# Patient Record
Sex: Male | Born: 1980 | Race: White | Hispanic: No | Marital: Married | Smoking: Never smoker
Health system: Southern US, Community
[De-identification: ages and names within clinical notes are randomized; demographics above are authoritative.]

---

## 2009-05-31 ENCOUNTER — Encounter: Admission: RE | Admit: 2009-05-31 | Discharge: 2009-05-31 | Payer: Self-pay | Admitting: Family Medicine

## 2012-10-03 ENCOUNTER — Other Ambulatory Visit: Payer: Self-pay | Admitting: Internal Medicine

## 2012-10-03 DIAGNOSIS — E041 Nontoxic single thyroid nodule: Secondary | ICD-10-CM

## 2012-10-06 ENCOUNTER — Other Ambulatory Visit: Payer: Self-pay

## 2013-12-12 ENCOUNTER — Telehealth: Payer: Self-pay | Admitting: Family Medicine

## 2013-12-12 NOTE — Telephone Encounter (Signed)
Patient would be a new patient.  He is requesting to be worked in soon b/c of back and knee issues.  Thanks!

## 2013-12-12 NOTE — Telephone Encounter (Signed)
Spoke with pt, scheduled for 12/18/13 @ 11:30a.

## 2013-12-18 ENCOUNTER — Other Ambulatory Visit (INDEPENDENT_AMBULATORY_CARE_PROVIDER_SITE_OTHER): Payer: BC Managed Care – PPO

## 2013-12-18 ENCOUNTER — Ambulatory Visit (INDEPENDENT_AMBULATORY_CARE_PROVIDER_SITE_OTHER)
Admission: RE | Admit: 2013-12-18 | Discharge: 2013-12-18 | Disposition: A | Discharge status: Home / Self Care | Payer: BC Managed Care – PPO | Source: Ambulatory Visit | Attending: Family Medicine | Admitting: Family Medicine

## 2013-12-18 ENCOUNTER — Ambulatory Visit (INDEPENDENT_AMBULATORY_CARE_PROVIDER_SITE_OTHER): Payer: BC Managed Care – PPO | Admitting: Family Medicine

## 2013-12-18 VITALS — BP 122/72 | HR 99 | Ht 73.0 in | Wt 207.0 lb

## 2013-12-18 DIAGNOSIS — M23204 Derangement of unspecified medial meniscus due to old tear or injury, left knee: Secondary | ICD-10-CM

## 2013-12-18 DIAGNOSIS — M25519 Pain in unspecified shoulder: Secondary | ICD-10-CM

## 2013-12-18 DIAGNOSIS — M25569 Pain in unspecified knee: Secondary | ICD-10-CM

## 2013-12-18 DIAGNOSIS — M23209 Derangement of unspecified meniscus due to old tear or injury, unspecified knee: Secondary | ICD-10-CM | POA: Insufficient documentation

## 2013-12-18 DIAGNOSIS — M23302 Other meniscus derangements, unspecified lateral meniscus, unspecified knee: Secondary | ICD-10-CM

## 2013-12-18 DIAGNOSIS — M542 Cervicalgia: Secondary | ICD-10-CM

## 2013-12-18 DIAGNOSIS — M25562 Pain in left knee: Secondary | ICD-10-CM

## 2013-12-18 DIAGNOSIS — M23201 Derangement of unspecified lateral meniscus due to old tear or injury, left knee: Secondary | ICD-10-CM

## 2013-12-18 DIAGNOSIS — M25512 Pain in left shoulder: Secondary | ICD-10-CM

## 2013-12-18 DIAGNOSIS — M23207 Derangement of unspecified meniscus due to old tear or injury, left knee: Secondary | ICD-10-CM

## 2013-12-18 DIAGNOSIS — M25511 Pain in right shoulder: Secondary | ICD-10-CM | POA: Insufficient documentation

## 2013-12-18 MED ORDER — NITROGLYCERIN 0.2 MG/HR TD PT24: MEDICATED_PATCH | TRANSDERMAL | Status: DC

## 2013-12-18 NOTE — Patient Instructions (Addendum)
Ice 20 minutes 2 times daily. Usually after activity and before bed. Exercises 3 times a week. For shoulder and knee.  Try pennsaid 2 times daily and stop if it hurts your stomach.  Decrease seat height.  Cages for your feet.  Vitamin D 2000 Iu daily Turmeric  twice daily.  Fish oil 2 grams daily.  For shoulder decrease weight to 50% of what you doing and increase 10 % a week Xray of your neck, no news is good news.  Nitroglycerin Protocol   Apply 1/4 nitroglycerin patch to affected area daily.  Change position of patch within the affected area every 24 hours.  You may experience a headache during the first 1-2 weeks of using the patch, these should subside.  If you experience headaches after beginning nitroglycerin patch treatment, you may take your preferred over the counter pain reliever.  Another side effect of the nitroglycerin patch is skin irritation or rash related to patch adhesive.  Please notify our office if you develop more severe headaches or rash, and stop the patch.  Tendon healing with nitroglycerin patch may require 12 to 24 weeks depending on the extent of injury.  Men should not use if taking Viagra, Cialis, or Levitra.   Do not use if you have migraines or rosacea.   See me again in 3 weeks.

## 2013-12-18 NOTE — Assessment & Plan Note (Signed)
Injected today, HEP, icing, and will try exercises on a regular basis Discussed with swelling maybe need further evaluation for Autoimmune with family history but will monitor.  RTC in 3 weeks.

## 2013-12-18 NOTE — Progress Notes (Signed)
[name] [address] [place], [place] [address] Phone: [phone] Subjective:     CC: Left shoulder pain and left knee pain  [id number] Brett Hoffman is a 33 y.o. male coming in with complaint of left shoulder and left knee pain.  Patient states left shoulder pain has been hurting for quite some time. Patient is a past medical history of having a labral tear and having that repaired multiple years ago. Patient states since that time he's always had a dull aching pain that seems to be worsening somewhat. States it hurts more when he does certain activities such as reaching across his body or trying to hold any type of heavy weight. Denies any numbness or tingling and denies any radiation down the arm. Denies any swelling but does have her seen. States that he feels he never became 100% after the surgery itself. Severity 7/10  Patient is also complaining of left knee pain. Seems to be on the lateral aspect of the knee. Patient has been biking significantly more and since then has had some mild discomfort. Patient states they can be a dull throbbing pain and give him some time to signs of instability. Patient denies any clicking but there is a lot of grinding he states. Patient does not remember any true injury but did do a lot of high impact sports when he was younger. The severity is 6/10.     Past medical history, social, surgical and family history all reviewed in electronic medical record.   Review of Systems: No headache, visual changes, nausea, vomiting, diarrhea, constipation, dizziness, abdominal pain, skin rash, fevers, chills, night sweats, weight loss, swollen lymph nodes, body aches, joint swelling, muscle aches, chest pain, shortness of breath, mood changes.   Objective Blood pressure 122/72, pulse 99, height  (1.854 m), weight 207 lb (93.895 kg), SpO2 96.00%.  General: No apparent distress alert and oriented x3 mood and affect normal,  dressed appropriately.  HEENT: Pupils equal, extraocular movements intact  Respiratory: Patient's speak in full sentences and does not appear short of breath  Cardiovascular: No lower extremity edema, non tender, no erythema  Skin: Warm dry intact with no signs of infection or rash on extremities or on axial skeleton.  Abdomen: Soft nontender  Neuro: Cranial nerves II through XII are intact, neurovascularly intact in all extremities with 2+ DTRs and 2+ pulses.  Lymph: No lymphadenopathy of posterior or anterior cervical chain or axillae bilaterally.  Gait normal with good balance and coordination.  MSK:  Non tender with full range of motion and good stability and symmetric strength and tone of  elbows, wrist, hip, and ankles bilaterally.  Shoulder: Left Inspection reveals no abnormalities, atrophy or asymmetry. Palpation is normal with no tenderness over AC joint or bicipital groove. ROM is full in all planes. 4/5 rotator cuff strength compared to the contralateral side. Positive signs of impingement Speeds and Yergason's tests normal. No labral pathology noted with negative Obrien's, negative clunk and good stability. Normal scapular function observed. No painful arc and no drop arm sign. No apprehension sign Lateral shoulder unremarkable    Knee: Left knee Normal to inspection with no erythema or effusion or obvious bony abnormalities. Moderate tenderness to palpation over the medial joint line ROM full in flexion and extension and lower leg rotation. Ligaments with solid consistent endpoints including ACL, PCL, LCL, MCL. Positive Mcmurray's, Apley's, and Thessalonian tests. Non painful patellar compression. Patellar glide with crepitus. Patellar and quadriceps tendons unremarkable.  Hamstring and quadriceps strength is normal.  Contralateral knee unremarkable  MSK US performed of: Left knee This study was ordered, performed, and interpreted by Terrilee Files D.O.  Knee: All  structures visualized. Chronic anterior medial and posterior medial meniscal tears noted. No significant displacement. Patient also does have an effusion of the suprapatellar pouch. Patellar Tendon unremarkable on long and transverse views without effusion. No abnormality of prepatellar bursa. LCL and MCL unremarkable on long and transverse views. No abnormality of origin of medial or lateral head of the gastrocnemius.  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   Procedure: Real-time Ultrasound Guided Injection of left knee Device: GE Logiq E  Ultrasound guided injection is preferred based studies that show increased duration, increased effect, greater accuracy, decreased procedural pain, increased response rate, and decreased cost with ultrasound guided versus blind injection.  Verbal informed consent obtained.  Time-out conducted.  Noted no overlying erythema, induration, or other signs of local infection.  Skin prepped in a sterile fashion.  Local anesthesia: Topical Ethyl chloride.  With sterile technique and under real time ultrasound guidance: With a 22-gauge 2 inch needle patient was injected with 4 cc of 0.5% Marcaine and 1 cc of Kenalog 40 mg/dL. This was from a superior lateral approach.  Completed without difficulty  Pain immediately resolved suggesting accurate placement of the medication.  Advised to call if fevers/chills, erythema, induration, drainage, or persistent bleeding.  Images permanently stored and available for review in the ultrasound unit.  Impression: Technically successful ultrasound guided injection.    Impression and Recommendations:     This case required medical decision making of moderate complexity.

## 2014-06-07 ENCOUNTER — Ambulatory Visit (INDEPENDENT_AMBULATORY_CARE_PROVIDER_SITE_OTHER): Payer: 59

## 2014-06-07 ENCOUNTER — Ambulatory Visit (INDEPENDENT_AMBULATORY_CARE_PROVIDER_SITE_OTHER): Payer: 59 | Admitting: Sports Medicine

## 2014-06-07 ENCOUNTER — Encounter: Payer: Self-pay | Admitting: Sports Medicine

## 2014-06-07 DIAGNOSIS — M23201 Derangement of unspecified lateral meniscus due to old tear or injury, left knee: Secondary | ICD-10-CM

## 2014-06-07 DIAGNOSIS — M23204 Derangement of unspecified medial meniscus due to old tear or injury, left knee: Principal | ICD-10-CM

## 2014-06-07 DIAGNOSIS — M23207 Derangement of unspecified meniscus due to old tear or injury, left knee: Secondary | ICD-10-CM | POA: Diagnosis not present

## 2014-06-07 DIAGNOSIS — M25562 Pain in left knee: Secondary | ICD-10-CM

## 2014-06-07 DIAGNOSIS — M25561 Pain in right knee: Secondary | ICD-10-CM

## 2014-06-07 MED ORDER — NAPROXEN-ESOMEPRAZOLE 500-20 MG PO TBEC: 1.0000 | DELAYED_RELEASE_TABLET | Freq: Two times a day (BID) | ORAL | Status: DC

## 2014-06-07 NOTE — Assessment & Plan Note (Signed)
Persistent pain on the lateral aspect of the left knee, slight response to return of pain after knee injection, there was no immediate relief in pain suggesting an extra-articular structure. No mechanical symptoms but does have some lateral popping suggestive of any plica versus patellofemoral. Custom orthotics as above. X-rays, MRI, return to discuss MRI results.

## 2014-06-07 NOTE — Progress Notes (Signed)

## 2014-06-11 ENCOUNTER — Telehealth: Payer: Self-pay | Admitting: *Deleted

## 2014-06-11 ENCOUNTER — Ambulatory Visit (INDEPENDENT_AMBULATORY_CARE_PROVIDER_SITE_OTHER): Payer: 59

## 2014-06-11 DIAGNOSIS — G8929 Other chronic pain: Secondary | ICD-10-CM | POA: Diagnosis not present

## 2014-06-11 DIAGNOSIS — R936 Abnormal findings on diagnostic imaging of limbs: Secondary | ICD-10-CM

## 2014-06-11 DIAGNOSIS — M23207 Derangement of unspecified meniscus due to old tear or injury, left knee: Principal | ICD-10-CM

## 2014-06-11 DIAGNOSIS — M25562 Pain in left knee: Secondary | ICD-10-CM | POA: Diagnosis not present

## 2014-06-11 DIAGNOSIS — M23201 Derangement of unspecified lateral meniscus due to old tear or injury, left knee: Secondary | ICD-10-CM

## 2014-06-11 DIAGNOSIS — M23204 Derangement of unspecified medial meniscus due to old tear or injury, left knee: Principal | ICD-10-CM

## 2014-06-11 NOTE — Telephone Encounter (Signed)
Prior authorization approved for MRI left knee without contrast.  Approval dates 06/09/14-07/24/14.  Berkley HarveyAuth #[id number]

## 2014-10-26 ENCOUNTER — Ambulatory Visit: Payer: 59 | Admitting: Sports Medicine

## 2014-10-30 ENCOUNTER — Ambulatory Visit: Payer: 59 | Admitting: Sports Medicine

## 2014-11-02 ENCOUNTER — Encounter: Payer: Self-pay | Admitting: Sports Medicine

## 2014-11-02 ENCOUNTER — Ambulatory Visit (INDEPENDENT_AMBULATORY_CARE_PROVIDER_SITE_OTHER): Payer: 59 | Admitting: Sports Medicine

## 2014-11-02 ENCOUNTER — Ambulatory Visit (INDEPENDENT_AMBULATORY_CARE_PROVIDER_SITE_OTHER): Payer: 59

## 2014-11-02 DIAGNOSIS — M542 Cervicalgia: Secondary | ICD-10-CM

## 2014-11-02 DIAGNOSIS — M25512 Pain in left shoulder: Secondary | ICD-10-CM

## 2014-11-02 MED ORDER — MELOXICAM 15 MG PO TABS: ORAL_TABLET | ORAL | Status: DC

## 2014-11-02 NOTE — Progress Notes (Signed)
  Subjective:    CC: Neck and shoulder pain  HPI: This is a pleasant 34 year old male, he has a history of a left shoulder labral tear postarthroscopy, unfortunately he has continued to have pain that he localizes at the anterior and posterior joint line, with grinding, and pain with overhead activities, moderate, persistent without radiation, he also has neck pain, with occasional periscapular radiation on the left side. Symptoms are moderate, persistent, nothing going down the arm or hand.  Past medical history, Surgical history, Family history not pertinant except as noted below, Social history, Allergies, and medications have been entered into the medical record, reviewed, and no changes needed.   Review of Systems: No fevers, chills, night sweats, weight loss, chest pain, or shortness of breath.   Objective:    General: Well Developed, well nourished, and in no acute distress.  Neuro: Alert and oriented x3, extra-ocular muscles intact, sensation grossly intact.  HEENT: Normocephalic, atraumatic, pupils equal round reactive to light, neck supple, no masses, no lymphadenopathy, thyroid nonpalpable.  Skin: Warm and dry, no rashes. Cardiac: Regular rate and rhythm, no murmurs rubs or gallops, no lower extremity edema.  Respiratory: Clear to auscultation bilaterally. Not using accessory muscles, speaking in full sentences. Left Shoulder: Inspection reveals no abnormalities, atrophy or asymmetry. Palpation is normal with no tenderness over AC joint or bicipital groove. ROM is full in all planes. Positive Hawkins test, negative Neer and empty can signs, supraspinatus is weak. Speeds and Yergason's tests normal. Good stability with no translational instability, there is a positive crank test, and negative clunk test. Negative O'Brien's test. Normal scapular function observed. No painful arc and no drop arm sign. No apprehension sign Neck: Negative spurling's Full neck range of  motion Grip strength and sensation normal in bilateral hands Strength good C4 to T1 distribution No sensory change to C4 to T1 Reflexes normal  Impression and Recommendations:

## 2014-11-02 NOTE — Assessment & Plan Note (Signed)
Clinically does resemble both subacromial impingement as well as labral injury, he does have a history of a labral tear diagnosed by arthroscopy. Symptoms are predominantly impingement today so we will proceed with meloxicam, x-rays, formal PT, does have some neck pain as well, with minimal periscapular radiculopathy on the left. Return to see me in 6 weeks, at that point we will probably get an MRI arthrogram considering prior labral tear, so we will perform an injection then, I will probably add some medication into the subacromial space as well.

## 2015-02-26 ENCOUNTER — Encounter: Payer: Self-pay | Admitting: Sports Medicine

## 2015-02-26 ENCOUNTER — Ambulatory Visit (INDEPENDENT_AMBULATORY_CARE_PROVIDER_SITE_OTHER): Payer: 59 | Admitting: Sports Medicine

## 2015-02-26 VITALS — BP 125/80 | HR 108 | Temp 98.5°F | Resp 18 | Wt 205.1 lb

## 2015-02-26 DIAGNOSIS — M7711 Lateral epicondylitis, right elbow: Secondary | ICD-10-CM | POA: Diagnosis not present

## 2015-02-26 MED ORDER — HYDROCODONE-ACETAMINOPHEN 5-325 MG PO TABS: 1.0000 | ORAL_TABLET | Freq: Three times a day (TID) | ORAL | Status: DC | PRN

## 2015-02-26 NOTE — Progress Notes (Signed)
  Subjective:    CC: Right elbow pain  HPI: For many months this pleasant 34 year old male has had pain that he localizes at the common extensor tendon origin on the right side, previously diagnosed as tennis elbow, he did have an unguided injection provided great relief. Unfortunately he is having recurrence of pain and desires repeat interventional treatment today. Pain is moderate, persistent without radiation.  Past medical history, Surgical history, Family history not pertinant except as noted below, Social history, Allergies, and medications have been entered into the medical record, reviewed, and no changes needed.   Review of Systems: No fevers, chills, night sweats, weight loss, chest pain, or shortness of breath.   Objective:    General: Well Developed, well nourished, and in no acute distress.  Neuro: Alert and oriented x3, extra-ocular muscles intact, sensation grossly intact.  HEENT: Normocephalic, atraumatic, pupils equal round reactive to light, neck supple, no masses, no lymphadenopathy, thyroid nonpalpable.  Skin: Warm and dry, no rashes. Cardiac: Regular rate and rhythm, no murmurs rubs or gallops, no lower extremity edema.  Respiratory: Clear to auscultation bilaterally. Not using accessory muscles, speaking in full sentences. Right Elbow: Unremarkable to inspection. Range of motion full pronation, supination, flexion, extension. Strength is full to all of the above directions Stable to varus, valgus stress. Negative moving valgus stress test. Tender to palpation at the common extensor tendon origin Ulnar nerve does not sublux. Negative cubital tunnel Tinel's.  Procedure: Real-time Ultrasound Guided Injection of right percutaneous needle tenotomy of the common extensor tendon origin  Device: GE Logiq E  Verbal informed consent obtained.  Time-out conducted.  Noted no overlying erythema, induration, or other signs of local infection.  Skin prepped in a sterile  fashion.  Local anesthesia: Topical Ethyl chloride.  With sterile technique and under real time ultrasound guidance:  using a 25-gauge needle and made approximately 20 passes through the tendon, to its insertion on the lateral epicondyle while injecting a total of 1 mL kenalog 40, 5mL lidocaine. Completed without difficulty  Pain immediately resolved suggesting accurate placement of the medication.  Advised to call if fevers/chills, erythema, induration, drainage, or persistent bleeding.  Images permanently stored and available for review in the ultrasound unit.  Impression: Technically successful ultrasound guided injection.  Impression and Recommendations:

## 2015-02-26 NOTE — Assessment & Plan Note (Signed)
Percutaneous needle tenotomy as above. Strap with compressive dressing, home rehabilitation exercises given, Vicodin for postprocedural pain. Return in one month, consider PRP if no better.

## 2015-10-10 ENCOUNTER — Encounter: Payer: Self-pay | Admitting: Sports Medicine

## 2015-10-10 ENCOUNTER — Ambulatory Visit (INDEPENDENT_AMBULATORY_CARE_PROVIDER_SITE_OTHER): Payer: Managed Care, Other (non HMO) | Admitting: Sports Medicine

## 2015-10-10 VITALS — BP 123/76 | HR 108 | Resp 18 | Wt 209.6 lb

## 2015-10-10 DIAGNOSIS — M7711 Lateral epicondylitis, right elbow: Secondary | ICD-10-CM

## 2015-10-10 NOTE — Assessment & Plan Note (Addendum)
7 month response to previous percutaneous fenestration, repeat injection today, if insufficient response patient will return for PRP injection. Rehabilitation exercises given.

## 2015-10-10 NOTE — Progress Notes (Signed)
  Subjective:    CC: Right elbow pain  HPI: I injected Brett Hoffman's right common extensor tendon origin with a percutaneous tenotomy approximately 7 months ago, he is having a recurrence of pain, desires an additional steroid injection before considering PRP. Pain is moderate, persistent without radiation. Worse with gripping objects.  Past medical history, Surgical history, Family history not pertinant except as noted below, Social history, Allergies, and medications have been entered into the medical record, reviewed, and no changes needed.   Review of Systems: No fevers, chills, night sweats, weight loss, chest pain, or shortness of breath.   Objective:    General: Well Developed, well nourished, and in no acute distress.  Neuro: Alert and oriented x3, extra-ocular muscles intact, sensation grossly intact.  HEENT: Normocephalic, atraumatic, pupils equal round reactive to light, neck supple, no masses, no lymphadenopathy, thyroid nonpalpable.  Skin: Warm and dry, no rashes. Cardiac: Regular rate and rhythm, no murmurs rubs or gallops, no lower extremity edema.  Respiratory: Clear to auscultation bilaterally. Not using accessory muscles, speaking in full sentences. Right Elbow: Unremarkable to inspection. Range of motion full pronation, supination, flexion, extension. Strength is full to all of the above directions Stable to varus, valgus stress. Negative moving valgus stress test. Tender to palpation at the right common extensor tendon origin Ulnar nerve does not sublux. Negative cubital tunnel Tinel's.  Procedure: Real-time Ultrasound Guided Injection of right common extensor tendon origin Device: GE Logiq E  Verbal informed consent obtained.  Time-out conducted.  Noted no overlying erythema, induration, or other signs of local infection.  Skin prepped in a sterile fashion.  Local anesthesia: Topical Ethyl chloride.  With sterile technique and under real time ultrasound guidance:   Noted calcifications in the proximal extensor tendon, injected medication both superficial to and deep to the extensor tendon, a total of 1 mL kenalog 40, 1 mL lidocaine, 1 mL Marcaine was used. Completed without difficulty  Pain immediately resolved suggesting accurate placement of the medication.  Advised to call if fevers/chills, erythema, induration, drainage, or persistent bleeding.  Images permanently stored and available for review in the ultrasound unit.  Impression: Technically successful ultrasound guided injection.  Impression and Recommendations:

## 2016-01-09 ENCOUNTER — Ambulatory Visit (INDEPENDENT_AMBULATORY_CARE_PROVIDER_SITE_OTHER): Payer: Managed Care, Other (non HMO) | Admitting: Sports Medicine

## 2016-01-09 DIAGNOSIS — M7711 Lateral epicondylitis, right elbow: Secondary | ICD-10-CM

## 2016-01-09 MED ORDER — HYDROCODONE-ACETAMINOPHEN 5-325 MG PO TABS: 1.0000 | ORAL_TABLET | Freq: Three times a day (TID) | ORAL | 0 refills | Status: DC | PRN

## 2016-01-09 NOTE — Assessment & Plan Note (Signed)
After failure of to steroid injections, bracing, NSAIDs, rehabilitation we proceeded with PRP today. We used a sample PRP centrifuge kit. Hydrocodone for postoperative pain.  Return to see me in one month.

## 2016-01-09 NOTE — Progress Notes (Signed)
  Procedure: Real-time Ultrasound Guided Platelet Rich Plasma (PRP) Injection of  Device: GE Logiq E  Verbal informed consent obtained.  Time-out conducted.  Noted no overlying erythema, induration, or other signs of local infection.  Obtained 30 cc of blood from peripheral vein, using the "PEAK" centrifuge, red blood cells were separated from the plasma. Subsequently red blood cells were drained leaving only plasma with the buffy coat layer between the desired lines. Platelet poor plasma was then centrifuged out, and remaining platelet rich plasma aspirated into a 5 cc syringe.  Skin prepped in a sterile fashion.  Local anesthesia: Topical Ethyl chloride.  With sterile technique and under real time ultrasound guidance the platelet rich plasma (PRP) obtained above:  I injected 2 mL lidocaine, 2 mL Marcaine superficial to and deep to the common extensor tendon origin for anesthesia, syringe switched and 3 mL platelet rich plasma injected into and around the common extensor tendon Completed without difficulty  Advised to call if fevers/chills, erythema, induration, drainage, or persistent bleeding.  Images permanently stored and available for review in the ultrasound unit.  Impression: Technically successful ultrasound guided Platelet Rich Plasma (PRP) injection.  I spent 40 minutes with this patient, greater than 50% was face-to-face time counseling and procedural time regarding the below diagnosis.

## 2016-01-10 ENCOUNTER — Ambulatory Visit: Payer: Managed Care, Other (non HMO) | Admitting: Sports Medicine

## 2016-02-06 ENCOUNTER — Ambulatory Visit (INDEPENDENT_AMBULATORY_CARE_PROVIDER_SITE_OTHER): Payer: Managed Care, Other (non HMO)

## 2016-02-06 ENCOUNTER — Encounter: Payer: Self-pay | Admitting: Sports Medicine

## 2016-02-06 ENCOUNTER — Ambulatory Visit (INDEPENDENT_AMBULATORY_CARE_PROVIDER_SITE_OTHER): Payer: Managed Care, Other (non HMO) | Admitting: Sports Medicine

## 2016-02-06 DIAGNOSIS — M7711 Lateral epicondylitis, right elbow: Secondary | ICD-10-CM

## 2016-02-06 DIAGNOSIS — M25821 Other specified joint disorders, right elbow: Secondary | ICD-10-CM | POA: Diagnosis not present

## 2016-02-06 MED ORDER — DIAZEPAM 5 MG PO TABS: ORAL_TABLET | ORAL | 0 refills | Status: DC

## 2016-02-06 NOTE — Progress Notes (Signed)
  Subjective:    CC: Follow-up  HPI: Brett Hoffman returns 1 month post PRP injection for a right tennis elbow, he had failed months of physical therapy, bracing, steroid injections, NSAIDs. Unfortunately his pain is really not much better, continues to be localized over the lateral epicondyle, moderate, persistent.  Past medical history:  Negative.  See flowsheet/record as well for more information.  Surgical history: Negative.  See flowsheet/record as well for more information.  Family history: Negative.  See flowsheet/record as well for more information.  Social history: Negative.  See flowsheet/record as well for more information.  Allergies, and medications have been entered into the medical record, reviewed, and no changes needed.   Review of Systems: No fevers, chills, night sweats, weight loss, chest pain, or shortness of breath.   Objective:    General: Well Developed, well nourished, and in no acute distress.  Neuro: Alert and oriented x3, extra-ocular muscles intact, sensation grossly intact.  HEENT: Normocephalic, atraumatic, pupils equal round reactive to light, neck supple, no masses, no lymphadenopathy, thyroid nonpalpable.  Skin: Warm and dry, no rashes. Cardiac: Regular rate and rhythm, no murmurs rubs or gallops, no lower extremity edema.  Respiratory: Clear to auscultation bilaterally. Not using accessory muscles, speaking in full sentences. Right Elbow: Unremarkable to inspection. Range of motion full pronation, supination, flexion, extension. Strength is full to all of the above directions Stable to varus, valgus stress. Negative moving valgus stress test. Persistent exquisite tenderness the common extensor tendon origin Ulnar nerve does not sublux. Negative cubital tunnel Tinel's.  Impression and Recommendations:    Right tennis elbow Failed physical therapy, steroid injection, PRP. At this point we are going to proceed with MRI and referral to surgery.

## 2016-02-06 NOTE — Addendum Note (Signed)
Addended by: Monica BectonHEKKEKANDAM, Camellia Popescu J on: 02/06/2016 01:18 PM   Modules accepted: Orders

## 2016-02-06 NOTE — Addendum Note (Signed)
Addended by: Monica BectonHEKKEKANDAM, Adriyanna Christians J on: 02/06/2016 12:10 PM   Modules accepted: Orders

## 2016-02-06 NOTE — Assessment & Plan Note (Addendum)
Failed physical therapy, steroid injection, PRP. At this point we are going to proceed with MRI and referral to surgery. Valium for preprocedural anxiolysis

## 2016-02-07 ENCOUNTER — Other Ambulatory Visit: Payer: Self-pay | Admitting: Sports Medicine

## 2016-02-07 DIAGNOSIS — M7711 Lateral epicondylitis, right elbow: Secondary | ICD-10-CM

## 2016-02-07 MED ORDER — DIAZEPAM 5 MG PO TABS: ORAL_TABLET | ORAL | 0 refills | Status: DC

## 2016-02-08 ENCOUNTER — Ambulatory Visit (HOSPITAL_BASED_OUTPATIENT_CLINIC_OR_DEPARTMENT_OTHER)
Admission: RE | Admit: 2016-02-08 | Discharge: 2016-02-08 | Disposition: A | Discharge status: Home / Self Care | Payer: Managed Care, Other (non HMO) | Source: Ambulatory Visit | Attending: Sports Medicine | Admitting: Sports Medicine

## 2016-02-08 DIAGNOSIS — M7711 Lateral epicondylitis, right elbow: Secondary | ICD-10-CM | POA: Insufficient documentation

## 2016-03-31 ENCOUNTER — Other Ambulatory Visit: Payer: Self-pay | Admitting: Family Medicine

## 2016-03-31 MED ORDER — VITAMIN D (ERGOCALCIFEROL) 1.25 MG (50000 UNIT) PO CAPS: 50000.0000 [IU] | ORAL_CAPSULE | ORAL | 0 refills | Status: DC

## 2016-08-05 ENCOUNTER — Institutional Professional Consult (permissible substitution): Payer: Managed Care, Other (non HMO) | Admitting: Internal Medicine

## 2017-02-23 ENCOUNTER — Ambulatory Visit (INDEPENDENT_AMBULATORY_CARE_PROVIDER_SITE_OTHER): Payer: 59 | Admitting: Sports Medicine

## 2017-02-23 DIAGNOSIS — G5603 Carpal tunnel syndrome, bilateral upper limbs: Secondary | ICD-10-CM | POA: Diagnosis not present

## 2017-02-23 DIAGNOSIS — M25511 Pain in right shoulder: Secondary | ICD-10-CM

## 2017-02-23 DIAGNOSIS — Z0000 Encounter for general adult medical examination without abnormal findings: Secondary | ICD-10-CM | POA: Diagnosis not present

## 2017-02-23 DIAGNOSIS — M25512 Pain in left shoulder: Secondary | ICD-10-CM | POA: Diagnosis not present

## 2017-02-23 DIAGNOSIS — G8929 Other chronic pain: Secondary | ICD-10-CM | POA: Diagnosis not present

## 2017-02-23 DIAGNOSIS — R635 Abnormal weight gain: Secondary | ICD-10-CM | POA: Diagnosis not present

## 2017-02-23 DIAGNOSIS — F411 Generalized anxiety disorder: Secondary | ICD-10-CM

## 2017-02-23 MED ORDER — MELOXICAM 15 MG PO TABS: ORAL_TABLET | ORAL | 3 refills | Status: DC

## 2017-02-23 MED ORDER — VORTIOXETINE HBR 10 MG PO TABS: 1.0000 | ORAL_TABLET | Freq: Every day | ORAL | 3 refills | Status: DC

## 2017-02-23 MED ORDER — PHENTERMINE HCL 37.5 MG PO TABS: ORAL_TABLET | ORAL | 0 refills | Status: DC

## 2017-02-23 NOTE — Assessment & Plan Note (Signed)
Starting phentermine, return monthly for weight checks and refills. 

## 2017-02-23 NOTE — Assessment & Plan Note (Signed)
Patient is to obtain bilateral nighttime extension splinting

## 2017-02-23 NOTE — Assessment & Plan Note (Signed)
Has been on Zoloft 50 mg for a long time, we are going to taper him down off of this, Zoloft is weight positive, is insufficiently controlling his symptoms and does come with sexual adverse effects. Starting 10 mg of Trintellix. Return in 1 month for PHQ/GAD.

## 2017-02-23 NOTE — Assessment & Plan Note (Signed)
Impingement related pain, history of right acromioclavicular separation as well as a left labral tear post repair. He has exercise bands, I am going to print about the rotator cuff rehab exercises. Return in 1 month to 6 weeks for this. Adding Mobic at bedtime.

## 2017-02-23 NOTE — Assessment & Plan Note (Signed)
Adding routine labs 

## 2017-02-23 NOTE — Progress Notes (Signed)
  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  Abnormal weight gain: Would like to start weight loss medication.  Generalized anxiety: Has been on Zoloft 50 mg for some time, unfortunately he is noting anorgasmia, as well as weight gain.  Bilateral hand pain: Worse at night and in the morning, wakes up with paresthesias, numbness and tingling through all of his fingers.  Moderate, persistent.  Bilateral shoulder pain: History of right acromial clavicular separation and a left labral tear status post repair, pain is over the deltoid, worse with overhead activities, no mechanical symptoms, moderate, persistent.  Past medical history:  Negative.  See flowsheet/record as well for more information.  Surgical history: Negative.  See flowsheet/record as well for more information.  Family history: Negative.  See flowsheet/record as well for more information.  Social history: Negative.  See flowsheet/record as well for more information.  Allergies, and medications have been entered into the medical record, reviewed, and no changes needed.   Review of Systems: No fevers, chills, night sweats, weight loss, chest pain, or shortness of breath.   Objective:    General: Well Developed, well nourished, and in no acute distress.  Neuro: Alert and oriented x3, extra-ocular muscles intact, sensation grossly intact.  HEENT: Normocephalic, atraumatic, pupils equal round reactive to light, neck supple, no masses, no lymphadenopathy, thyroid nonpalpable.  Skin: Warm and dry, no rashes. Cardiac: Regular rate and rhythm, no murmurs rubs or gallops, no lower extremity edema.  Respiratory: Clear to auscultation bilaterally. Not using accessory muscles, speaking in full sentences. Bilateral wrists: Inspection normal with no visible erythema or swelling. ROM smooth and normal with good flexion and extension and ulnar/radial  deviation that is symmetrical with opposite wrist. Palpation is normal over metacarpals, navicular, lunate, and TFCC; tendons without tenderness/ swelling No snuffbox tenderness. No tenderness over Canal of Gu[place]. Strength 5/5 in all directions without pain. Bilateral positive Tinel sign Negative Finkelstein sign. Negative Watson's test.  Impression and Recommendations:    Abnormal weight gain Starting phentermine, return monthly for weight checks and refills.  Bilateral shoulder pain Impingement related pain, history of right acromioclavicular separation as well as a left labral tear post repair. He has exercise bands, I am going to print about the rotator cuff rehab exercises. Return in 1 month to 6 weeks for this. Adding Mobic at bedtime.  Generalized anxiety disorder Has been on Zoloft 50 mg for a long time, we are going to taper him down off of this, Zoloft is weight positive, is insufficiently controlling his symptoms and does come with sexual adverse effects. Starting 10 mg of Trintellix. Return in 1 month for PHQ/GAD.  Annual physical exam Adding routine labs.  Carpal tunnel syndrome on both sides Patient is to obtain bilateral nighttime extension splinting  ___________________________________________ Brett Hoffman, M.D., ABFM., CAQSM. Primary Care and Sports Medicine [facility]  Adjunct Instructor of Family Medicine  [employer]

## 2017-04-01 ENCOUNTER — Telehealth: Payer: Self-pay | Admitting: Sports Medicine

## 2017-04-01 MED ORDER — VORTIOXETINE HBR 20 MG PO TABS: 20.0000 mg | ORAL_TABLET | Freq: Every day | ORAL | 3 refills | Status: DC

## 2017-04-01 NOTE — Telephone Encounter (Signed)
After 6 weeks, insufficient efficacy.  Increase Trintellix to 20 mg. Return in 4 weeks for PHQ 9 and GAD 7. Discontinue phentermine, ineffective.  We may try phendimetrazine in the future.

## 2017-04-16 ENCOUNTER — Telehealth: Payer: Self-pay | Admitting: Sports Medicine

## 2017-04-16 MED ORDER — SERTRALINE HCL 25 MG PO TABS: 25.0000 mg | ORAL_TABLET | Freq: Every day | ORAL | 3 refills | Status: DC

## 2017-04-16 NOTE — Telephone Encounter (Signed)
Trintellix too expensive, switching back to Zoloft.

## 2017-05-10 ENCOUNTER — Other Ambulatory Visit: Payer: Self-pay | Admitting: Sports Medicine

## 2017-05-10 ENCOUNTER — Encounter: Payer: Self-pay | Admitting: Sports Medicine

## 2017-05-10 ENCOUNTER — Ambulatory Visit (INDEPENDENT_AMBULATORY_CARE_PROVIDER_SITE_OTHER): Payer: 59

## 2017-05-10 ENCOUNTER — Ambulatory Visit (INDEPENDENT_AMBULATORY_CARE_PROVIDER_SITE_OTHER): Payer: 59 | Admitting: Sports Medicine

## 2017-05-10 DIAGNOSIS — S99922A Unspecified injury of left foot, initial encounter: Secondary | ICD-10-CM

## 2017-05-10 DIAGNOSIS — E041 Nontoxic single thyroid nodule: Secondary | ICD-10-CM | POA: Diagnosis not present

## 2017-05-10 DIAGNOSIS — S90212A Contusion of left great toe with damage to nail, initial encounter: Secondary | ICD-10-CM | POA: Diagnosis not present

## 2017-05-10 DIAGNOSIS — W228XXA Striking against or struck by other objects, initial encounter: Secondary | ICD-10-CM

## 2017-05-10 NOTE — Assessment & Plan Note (Signed)
Trephination x3 with a scant amount of serosanguineous drainage. Keep it covered. Return as needed.

## 2017-05-10 NOTE — Assessment & Plan Note (Signed)
History of thyroid nodule, question palpable nodule in the right thyroid lobe. Adding TFTs as well as thyroid ultrasound.

## 2017-05-10 NOTE — Progress Notes (Signed)
Subjective:    CC: Foot trauma  HPI: Brett Hoffman is a pleasant 37 year old male, this week and he dropped his benchpress rack on his foot, left side, most of the trauma was to the first and second toes.  He had immediate bleeding, pain, as well as a subungual hematoma, moderate, persistent.  He is here for further evaluation and definitive treatment.  I reviewed the past medical history, family history, social history, surgical history, and allergies today and no changes were needed.  Please see the problem list section below in epic for further details.  Past Medical History: History reviewed. No pertinent past medical history. Past Surgical History: History reviewed. No pertinent surgical history. Social History: Social History   Socioeconomic History  . Marital status: Married    Spouse name: None  . Number of children: None  . Years of education: None  . Highest education level: None  Social Needs  . Financial resource strain: None  . Food insecurity - worry: None  . Food insecurity - inability: None  . Transportation needs - medical: None  . Transportation needs - non-medical: None  Occupational History  . None  Tobacco Use  . Smoking status: Never Smoker  . Smokeless tobacco: Never Used  Substance and Sexual Activity  . Alcohol use: None  . Drug use: None  . Sexual activity: None  Other Topics Concern  . None  Social History Narrative  . None   Family History: No family history on file. Allergies: No Known Allergies Medications: See med rec.  Review of Systems: No fevers, chills, night sweats, weight loss, chest pain, or shortness of breath.   Objective:    General: Well Developed, well nourished, and in no acute distress.  Neuro: Alert and oriented x3, extra-ocular muscles intact, sensation grossly intact.  HEENT: Normocephalic, atraumatic, pupils equal round reactive to light, neck supple, no masses, no lymphadenopathy, thyroid nonpalpable.  Question  palpable nodule in the right thyroid lobe. Skin: Warm and dry, no rashes. Cardiac: Regular rate and rhythm, no murmurs rubs or gallops, no lower extremity edema.  Respiratory: Clear to auscultation bilaterally. Not using accessory muscles, speaking in full sentences. Left foot: Visible subungual hematoma over the great toe, lesser so over the second toe. Range of motion is full in all directions. Strength is 5/5 in all directions. No hallux valgus. No pes cavus or pes planus. No abnormal callus noted. No pain over the navicular prominence, or base of fifth metatarsal. No tenderness to palpation of the calcaneal insertion of plantar fascia. No pain at the Achilles insertion. No pain over the calcaneal bursa. No pain of the retrocalcaneal bursa. No tenderness to palpation over the tarsals, metatarsals, or phalanges. No hallux rigidus or limitus. No tenderness palpation over interphalangeal joints. No pain with compression of the metatarsal heads. Neurovascularly intact distally.  X-rays reviewed and are unremarkable for fracture, there is a questionable tuft fracture in the first distal phalanx.  Procedure: Trephination of left great toenail subungual hematoma Risks, benefits, and alternatives explained and consent obtained. Time out conducted. I used a disposable electrocautery device to drill 3 holes in the dorsum of the nail, I was able to express a good amount of serosanguineous discharge. Pt stable.  Impression and Recommendations:    Subungual hematoma of great toe of left foot Trephination x3 with a scant amount of serosanguineous drainage. Keep it covered. Return as needed.  Thyroid nodule History of thyroid nodule, question palpable nodule in the right thyroid lobe. Adding TFTs as  well as thyroid ultrasound.  ___________________________________________ Ihor Austinhomas J. Benjamin Stainhekkekandam, M.D., ABFM., CAQSM. Primary Care and Sports Medicine [facility]  [facility]  Adjunct Instructor of Family Medicine  [employer]

## 2017-05-11 LAB — HEMOGLOBIN A1C
Hgb A1c MFr Bld: 4.7 % of total Hgb (ref <5.7)
Mean Plasma Glucose: 88 (calc)
eAG (mmol/L): 4.9 (calc)

## 2017-05-11 LAB — CBC
HCT: 48.4 % (ref 38.5–50.0)
Hemoglobin: 16.8 g/dL (ref 13.2–17.1)
MCH: 32.5 pg (ref 27.0–33.0)
MCHC: 34.7 g/dL (ref 32.0–36.0)
MCV: 93.6 fL (ref 80.0–100.0)
MPV: 11.3 fL (ref 7.5–12.5)
Platelets: 241 10*3/uL (ref 140–400)
RBC: 5.17 Million/uL (ref 4.20–5.80)
RDW: 12 % (ref 11.0–15.0)
WBC: 7.5 Thousand/uL (ref 3.8–10.8)

## 2017-05-11 LAB — COMPREHENSIVE METABOLIC PANEL
AG Ratio: 2 (calc) (ref 1.0–2.5)
ALT: 21 U/L (ref 9–46)
AST: 22 U/L (ref 10–40)
BUN: 12 mg/dL (ref 7–25)
CO2: 30 mmol/L (ref 20–32)
Glucose, Bld: 90 mg/dL (ref 65–99)

## 2017-05-11 LAB — COMPREHENSIVE METABOLIC PANEL WITH GFR
Albumin: 4.7 g/dL (ref 3.6–5.1)
Alkaline phosphatase (APISO): 58 U/L (ref 40–115)
Calcium: 9.6 mg/dL (ref 8.6–10.3)
Chloride: 102 mmol/L (ref 98–110)
Creat: 0.94 mg/dL (ref 0.60–1.35)
Globulin: 2.3 g/dL (ref 1.9–3.7)
Potassium: 4.4 mmol/L (ref 3.5–5.3)
Sodium: 138 mmol/L (ref 135–146)
Total Bilirubin: 0.6 mg/dL (ref 0.2–1.2)
Total Protein: 7 g/dL (ref 6.1–8.1)

## 2017-05-11 LAB — LIPID PANEL W/REFLEX DIRECT LDL
Cholesterol: 186 mg/dL (ref <200)
HDL: 56 mg/dL (ref >40)
LDL Cholesterol (Calc): 114 mg/dL — ABNORMAL HIGH
Non-HDL Cholesterol (Calc): 130 mg/dL (calc) — ABNORMAL HIGH (ref <130)
Total CHOL/HDL Ratio: 3.3 (calc) (ref <5.0)
Triglycerides: 69 mg/dL (ref <150)

## 2017-05-11 LAB — VITAMIN D 25 HYDROXY (VIT D DEFICIENCY, FRACTURES): Vit D, 25-Hydroxy: 32 ng/mL (ref 30–100)

## 2017-05-11 LAB — TSH: TSH: 1.86 m[IU]/L (ref 0.40–4.50)

## 2017-05-11 LAB — HIV ANTIBODY (ROUTINE TESTING W REFLEX): HIV 1&2 Ab, 4th Generation: NONREACTIVE

## 2017-08-11 ENCOUNTER — Other Ambulatory Visit: Payer: Self-pay | Admitting: Sports Medicine

## 2017-11-25 ENCOUNTER — Other Ambulatory Visit: Payer: 59

## 2017-11-30 ENCOUNTER — Ambulatory Visit (INDEPENDENT_AMBULATORY_CARE_PROVIDER_SITE_OTHER): Payer: 59

## 2017-11-30 DIAGNOSIS — E041 Nontoxic single thyroid nodule: Secondary | ICD-10-CM

## 2017-12-12 ENCOUNTER — Other Ambulatory Visit: Payer: Self-pay | Admitting: Sports Medicine

## 2018-04-17 ENCOUNTER — Other Ambulatory Visit: Payer: Self-pay | Admitting: Sports Medicine

## 2018-08-24 ENCOUNTER — Other Ambulatory Visit: Payer: Self-pay | Admitting: Sports Medicine

## 2018-08-25 ENCOUNTER — Ambulatory Visit (INDEPENDENT_AMBULATORY_CARE_PROVIDER_SITE_OTHER): Payer: 59 | Admitting: Sports Medicine

## 2018-08-25 ENCOUNTER — Encounter: Payer: Self-pay | Admitting: Sports Medicine

## 2018-08-25 VITALS — BP 133/82 | HR 95 | Wt 229.0 lb

## 2018-08-25 DIAGNOSIS — G5603 Carpal tunnel syndrome, bilateral upper limbs: Secondary | ICD-10-CM | POA: Diagnosis not present

## 2018-08-25 DIAGNOSIS — J4540 Moderate persistent asthma, uncomplicated: Secondary | ICD-10-CM

## 2018-08-25 DIAGNOSIS — R0609 Other forms of dyspnea: Secondary | ICD-10-CM

## 2018-08-25 LAB — PULMONARY FUNCTION TEST

## 2018-08-25 MED ORDER — MONTELUKAST SODIUM 10 MG PO TABS: 10.0000 mg | ORAL_TABLET | Freq: Every day | ORAL | 3 refills | Status: DC

## 2018-08-25 MED ORDER — ALBUTEROL SULFATE HFA 108 (90 BASE) MCG/ACT IN AERS
4.0000 | INHALATION_SPRAY | Freq: Once | RESPIRATORY_TRACT | Status: AC
  Administered 2018-08-25: 4 via RESPIRATORY_TRACT

## 2018-08-25 MED ORDER — LORATADINE 10 MG PO TABS: 10.0000 mg | ORAL_TABLET | Freq: Every day | ORAL | 3 refills | Status: AC

## 2018-08-25 MED ORDER — ALBUTEROL SULFATE HFA 108 (90 BASE) MCG/ACT IN AERS: 2.0000 | INHALATION_SPRAY | Freq: Four times a day (QID) | RESPIRATORY_TRACT | 11 refills | Status: DC | PRN

## 2018-08-25 NOTE — Assessment & Plan Note (Addendum)
Asthma with daily symptoms, symptoms several nights of the week as well. There is also an exercise-induced component. Bronchodilator spirometry shows good reversibility and improvement in flow postbronchodilator. Because there is likely an allergic component we are going to do loratadine and montelukast. Refilling rescue inhaler. We can touch base in a month to discuss how symptoms are going. He will also do 2 puffs 15 minutes before exercise. If persistence of symptoms we will add an ICS/LABA combo.

## 2018-08-25 NOTE — Assessment & Plan Note (Signed)
Persistent bilateral hand numbness and tingling, proceeding with nerve conduction and EMG. If confirms carpal tunnel syndrome we will proceed with bilateral median nerve Hydro dissection with ultrasound guidance.

## 2018-08-25 NOTE — Progress Notes (Signed)
Subjective:    CC: Follow-up  HPI: Shortness of breath: With wheezing, present with activity, and now at rest.  Improved considerably with albuterol, mild cough, no muscle aches, body aches, fevers, chills, runny nose.  He is here for pre-and postbronchodilator spirometry.  Hand numbness and tingling: Clinically resembled carpal tunnel syndrome, we have been doing nighttime splinting, he has not yet improved.  Symptoms are moderate, persistent, localized in the wrist with radiation into the hand with numbness of most fingers.  I reviewed the past medical history, family history, social history, surgical history, and allergies today and no changes were needed.  Please see the problem list section below in epic for further details.  Past Medical History: No past medical history on file. Past Surgical History: No past surgical history on file. Social History: Social History   Socioeconomic History  . Marital status: Married    Spouse name: Not on file  . Number of children: Not on file  . Years of education: Not on file  . Highest education level: Not on file  Occupational History  . Not on file  Social Needs  . Financial resource strain: Not on file  . Food insecurity:    Worry: Not on file    Inability: Not on file  . Transportation needs:    Medical: Not on file    Non-medical: Not on file  Tobacco Use  . Smoking status: Never Smoker  . Smokeless tobacco: Never Used  Substance and Sexual Activity  . Alcohol use: Not on file  . Drug use: Not on file  . Sexual activity: Not on file  Lifestyle  . Physical activity:    Days per week: Not on file    Minutes per session: Not on file  . Stress: Not on file  Relationships  . Social connections:    Talks on phone: Not on file    Gets together: Not on file    Attends religious service: Not on file    Active member of club or organization: Not on file    Attends meetings of clubs or organizations: Not on file   Relationship status: Not on file  Other Topics Concern  . Not on file  Social History Narrative  . Not on file   Family History: No family history on file. Allergies: No Known Allergies Medications: See med rec.  Review of Systems: No fevers, chills, night sweats, weight loss, chest pain, or shortness of breath.   Objective:    General: Well Developed, well nourished, and in no acute distress.  Neuro: Alert and oriented x3, extra-ocular muscles intact, sensation grossly intact.  HEENT: Normocephalic, atraumatic, pupils equal round reactive to light, neck supple, no masses, no lymphadenopathy, thyroid nonpalpable.  Skin: Warm and dry, no rashes. Cardiac: Regular rate and rhythm, no murmurs rubs or gallops, no lower extremity edema.  Respiratory: Clear to auscultation bilaterally. Not using accessory muscles, speaking in full sentences.  Please see procedures tab for pre-and postbronchodilator spirometry.  Impression and Recommendations:    Asthma, moderate persistent Asthma with daily symptoms, symptoms several nights of the week as well. There is also an exercise-induced component. Bronchodilator spirometry shows good reversibility and improvement in flow postbronchodilator. Because there is likely an allergic component we are going to do loratadine and montelukast. Refilling rescue inhaler. We can touch base in a month to discuss how symptoms are going. He will also do 2 puffs 15 minutes before exercise. If persistence of symptoms we will add an  ICS/LABA combo.  Carpal tunnel syndrome on both sides Persistent bilateral hand numbness and tingling, proceeding with nerve conduction and EMG. If confirms carpal tunnel syndrome we will proceed with bilateral median nerve Hydro dissection with ultrasound guidance.   ___________________________________________ Ihor Austin. Benjamin Stain, M.D., ABFM., CAQSM. Primary Care and Sports Medicine [facility]   Adjunct Professor of Family Medicine  [employer]

## 2018-09-09 ENCOUNTER — Other Ambulatory Visit: Payer: Self-pay | Admitting: Sports Medicine

## 2018-09-22 ENCOUNTER — Other Ambulatory Visit: Payer: Self-pay

## 2018-09-22 ENCOUNTER — Ambulatory Visit (INDEPENDENT_AMBULATORY_CARE_PROVIDER_SITE_OTHER): Payer: 59 | Admitting: Diagnostic Neuroimaging

## 2018-09-22 ENCOUNTER — Encounter (INDEPENDENT_AMBULATORY_CARE_PROVIDER_SITE_OTHER): Payer: 59 | Admitting: Diagnostic Neuroimaging

## 2018-09-22 DIAGNOSIS — Z0289 Encounter for other administrative examinations: Secondary | ICD-10-CM

## 2018-09-22 DIAGNOSIS — R200 Anesthesia of skin: Secondary | ICD-10-CM

## 2018-09-22 NOTE — Procedures (Signed)
[facility]  NCS (NERVE CONDUCTION STUDY) WITH EMG (ELECTROMYOGRAPHY) REPORT   STUDY DATE: 09/22/18 PATIENT NAME: Brett Hoffman DOB: [birth year] MRN: [id number]  ORDERING CLINICIAN: Aundria Mems  TECHNOLOGIST: Sherre Scarlet   ELECTROMYOGRAPHER: Earlean Polka. Krysten Veronica, MD  CLINICAL INFORMATION: 66 year-year-old male here for right greater than left hand numbness x1 year.  FINDINGS: NERVE CONDUCTION STUDY: Right median motor response has prolonged distal latency, decreased amplitude, normal conduction velocity.  Left median motor response has normal distal latency, decreased amplitude, normal conduction velocity.  Bilateral ulnar motor responses are normal.  Right median sensory response could not be attained.  Left median sensory response has prolonged peak latency and normal amplitude.  Bilateral ulnar sensory responses are normal.    NEEDLE ELECTROMYOGRAPHY:  Needle examination of right upper extremity is normal.   IMPRESSION:   Abnormal study demonstrating: - Bilateral median neuropathies at the wrist consistent with bilateral carpal tunnel syndrome.  This is moderate severity on the right and mild on the left.    INTERPRETING PHYSICIAN:  Penni Bombard, MD Certified in Neurology, Neurophysiology and Neuroimaging  [facility] [address], [place], [place] [address] [phone]   [facility]    Nerve / Sites Muscle Latency Ref. Amplitude Ref. Rel Amp Segments Distance Velocity Ref. Area    ms ms mV mV %  cm m/s m/s mVms  R Median - APB     Wrist APB 5.5 ?4.4 3.2 ?4.0 100 Wrist - APB 7   8.8     Upper arm APB 10.1  3.9  122 Upper arm - Wrist 24 52 ?49 11.1  L Median - APB     Wrist APB 4.3 ?4.4 2.9 ?4.0 100 Wrist - APB 7   8.5     Upper arm APB 8.8  3.6  124 Upper arm - Wrist 24 54 ?49 10.5  R Ulnar - ADM     Wrist ADM 2.1 ?3.3 10.5 ?6.0 100 Wrist - ADM 7   30.5     B.Elbow ADM 5.8  8.8  83.2 B.Elbow -  Wrist 21 57 ?49 26.9     A.Elbow ADM 7.7  8.6  97.5 A.Elbow - B.Elbow 10 52 ?49 27.9         A.Elbow - Wrist      L Ulnar - ADM     Wrist ADM 2.4 ?3.3 10.8 ?6.0 100 Wrist - ADM 7   31.3     B.Elbow ADM 6.0  8.7  80.6 B.Elbow - Wrist 22 61 ?49 28.1     A.Elbow ADM 7.7  8.6  98.8 A.Elbow - B.Elbow 10 58 ?49 27.7         A.Elbow - Wrist                 SNC    Nerve / Sites Rec. Site Peak Lat Ref.  Amp Ref. Segments Distance    ms ms V V  cm  R Median - Orthodromic (Dig II, Mid palm)     Dig II Wrist NR ?3.4 NR ?10 Dig II - Wrist 13  L Median - Orthodromic (Dig II, Mid palm)     Dig II Wrist 4.3 ?3.4 11 ?10 Dig II - Wrist 13  R Ulnar - Orthodromic, (Dig V, Mid palm)     Dig V Wrist 2.4 ?3.1 11 ?5 Dig V - Wrist 11  L Ulnar - Orthodromic, (Dig V, Mid palm)     Dig V  Wrist 2.4 ?3.1 9 ?5 Dig V - Wrist 2211              F  Wave    Nerve F Lat Ref.   ms ms  R Ulnar - ADM 28.1 ?32.0  L Ulnar - ADM 29.1 ?32.0         EMG full       EMG Summary Table    Spontaneous MUAP Recruitment  Muscle IA Fib PSW Fasc Other Amp Dur. Poly Pattern  R. Deltoid Normal None None None _______ Normal Normal Normal Normal  R. Triceps brachii Normal None None None _______ Normal Normal Normal Normal  R. Biceps brachii Normal None None None _______ Normal Normal Normal Normal  R. Flexor carpi radialis Normal None None None _______ Normal Normal Normal Normal  R. First dorsal interosseous Normal None None None _______ Normal Normal Normal Normal

## 2018-09-26 ENCOUNTER — Other Ambulatory Visit: Payer: Self-pay | Admitting: Sports Medicine

## 2018-10-10 ENCOUNTER — Other Ambulatory Visit: Payer: Self-pay | Admitting: Sports Medicine

## 2018-10-12 ENCOUNTER — Ambulatory Visit (INDEPENDENT_AMBULATORY_CARE_PROVIDER_SITE_OTHER): Payer: 59 | Admitting: Sports Medicine

## 2018-10-12 ENCOUNTER — Other Ambulatory Visit: Payer: Self-pay

## 2018-10-12 ENCOUNTER — Encounter: Payer: Self-pay | Admitting: Sports Medicine

## 2018-10-12 ENCOUNTER — Other Ambulatory Visit: Payer: Self-pay | Admitting: Sports Medicine

## 2018-10-12 DIAGNOSIS — L237 Allergic contact dermatitis due to plants, except food: Secondary | ICD-10-CM | POA: Insufficient documentation

## 2018-10-12 MED ORDER — KETOROLAC TROMETHAMINE 15.75 MG/SPRAY NA SOLN: 1.0000 | Freq: Four times a day (QID) | NASAL | 3 refills | Status: AC | PRN

## 2018-10-12 MED ORDER — FLUOCINOLONE ACETONIDE 0.025 % EX CREA: TOPICAL_CREAM | Freq: Two times a day (BID) | CUTANEOUS | 0 refills | Status: DC

## 2018-10-12 NOTE — Assessment & Plan Note (Signed)
It is in the distribution that is consistent with dripping fluid, he did have a few lime and sodas at the [place] followed by sun exposure. Blisters have resolved. Adding topical fluocinolone ointment. Return as needed for this.

## 2018-10-12 NOTE — Progress Notes (Signed)
Subjective:    CC: Skin rash  HPI: Brett Hoffman is a very pleasant 38 year old male, he is one of our device reps, for the past several days he has had highly pruritic, slightly painful rash on his anterior abdomen, lower chest.  He recalls spending the day at the [place], in the sun.  He had a few drinks including a line with soda and may have spilled the lime on his anterior chest.  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  This feels similar, improving little by little.  I reviewed the past medical history, family history, social history, surgical history, and allergies today and no changes were needed.  Please see the problem list section below in epic for further details.  Past Medical History: No past medical history on file. Past Surgical History: No past surgical history on file. Social History: Social History   Socioeconomic History  . Marital status: Married    Spouse name: Not on file  . Number of children: Not on file  . Years of education: Not on file  . Highest education level: Not on file  Occupational History  . Not on file  Social Needs  . Financial resource strain: Not on file  . Food insecurity    Worry: Not on file    Inability: Not on file  . Transportation needs    Medical: Not on file    Non-medical: Not on file  Tobacco Use  . Smoking status: Never Smoker  . Smokeless tobacco: Never Used  Substance and Sexual Activity  . Alcohol use: Not on file  . Drug use: Not on file  . Sexual activity: Not on file  Lifestyle  . Physical activity    Days per week: Not on file    Minutes per session: Not on file  . Stress: Not on file  Relationships  . Social Herbalist on phone: Not on file    Gets together: Not on file    Attends religious service: Not on file    Active member of club or organization: Not on file    Attends meetings of clubs or organizations: Not on file    Relationship status: Not on file   Other Topics Concern  . Not on file  Social History Narrative  . Not on file   Family History: No family history on file. Allergies: No Known Allergies Medications: See med rec.  Review of Systems: No fevers, chills, night sweats, weight loss, chest pain, or shortness of breath.   Objective:    General: Well Developed, well nourished, and in no acute distress.  Neuro: Alert and oriented x3, extra-ocular muscles intact, sensation grossly intact.  HEENT: Normocephalic, atraumatic, pupils equal round reactive to light, neck supple, no masses, no lymphadenopathy, thyroid nonpalpable.  Skin: Warm and dry, there is an intensely erythematous, well-defined clear margins rash on his anterior lower chest and upper abdomen, its distribution appears splotchy, as if something dripped on his chest. Cardiac: Regular rate and rhythm, no murmurs rubs or gallops, no lower extremity edema.  Respiratory: Clear to auscultation bilaterally. Not using accessory muscles, speaking in full sentences.  Impression and Recommendations:    Acute phytophotodermatitis It is in the distribution that is consistent with dripping fluid, he did have a few lime and sodas at the [place] followed by sun exposure. Blisters have resolved. Adding topical fluocinolone ointment. Return as needed for this.   ___________________________________________ Gwen Her. Dianah Field, M.D., ABFM.,  CAQSM. Primary Care and Sports Medicine [facility]  Adjunct Professor of [employer]

## 2018-10-27 ENCOUNTER — Other Ambulatory Visit: Payer: Self-pay | Admitting: Sports Medicine

## 2018-10-27 MED ORDER — VORTIOXETINE HBR 20 MG PO TABS: 20.0000 mg | ORAL_TABLET | Freq: Every day | ORAL | 1 refills | Status: DC

## 2018-10-27 NOTE — Telephone Encounter (Signed)
Patient needs 90 day supply of medication per insurance. No PA needed.

## 2018-12-14 ENCOUNTER — Encounter: Payer: 59 | Admitting: Sports Medicine

## 2018-12-14 ENCOUNTER — Encounter: Payer: Self-pay | Admitting: Sports Medicine

## 2018-12-14 ENCOUNTER — Ambulatory Visit (INDEPENDENT_AMBULATORY_CARE_PROVIDER_SITE_OTHER): Payer: 59

## 2018-12-14 ENCOUNTER — Other Ambulatory Visit: Payer: Self-pay

## 2018-12-14 ENCOUNTER — Ambulatory Visit (INDEPENDENT_AMBULATORY_CARE_PROVIDER_SITE_OTHER): Payer: 59 | Admitting: Sports Medicine

## 2018-12-14 VITALS — BP 111/75 | HR 106 | Ht 73.0 in | Wt 229.0 lb

## 2018-12-14 DIAGNOSIS — G8929 Other chronic pain: Secondary | ICD-10-CM

## 2018-12-14 DIAGNOSIS — Z23 Encounter for immunization: Secondary | ICD-10-CM

## 2018-12-14 DIAGNOSIS — K580 Irritable bowel syndrome with diarrhea: Secondary | ICD-10-CM | POA: Diagnosis not present

## 2018-12-14 DIAGNOSIS — Z125 Encounter for screening for malignant neoplasm of prostate: Secondary | ICD-10-CM

## 2018-12-14 DIAGNOSIS — M25511 Pain in right shoulder: Secondary | ICD-10-CM | POA: Diagnosis not present

## 2018-12-14 DIAGNOSIS — Z0000 Encounter for general adult medical examination without abnormal findings: Secondary | ICD-10-CM | POA: Diagnosis not present

## 2018-12-14 DIAGNOSIS — E559 Vitamin D deficiency, unspecified: Secondary | ICD-10-CM

## 2018-12-14 DIAGNOSIS — E782 Mixed hyperlipidemia: Secondary | ICD-10-CM | POA: Diagnosis not present

## 2018-12-14 NOTE — Assessment & Plan Note (Signed)
I think the is referred pain from his cervical spine. It does radiate to the anterior chest. Adding cervical spine, chest x-ray, d-dimer. He will hold off on rows for now. Cervical spine and rhomboid rehab exercises given.

## 2018-12-14 NOTE — Progress Notes (Addendum)
Subjective:    CC: Annual physical  HPI:  [name] is here for his physical, he has a couple of complaints.  He has pain in his right upper chest, and around the scapula when he does rows.  Moderate, persistent, localized without radiation.  He does not really have much neck pain.  Some of his discomfort is pleuritic.  Present now for several months.  IBS: Diarrhea predominant, persistent but much better with Trintellix.  No melena, hematochezia.  I reviewed the past medical history, family history, social history, surgical history, and allergies today and no changes were needed.  Please see the problem list section below in epic for further details.  Past Medical History: No past medical history on file. Past Surgical History: No past surgical history on file. Social History: Social History   Socioeconomic History  . Marital status: Married    Spouse name: Not on file  . Number of children: Not on file  . Years of education: Not on file  . Highest education level: Not on file  Occupational History  . Not on file  Social Needs  . Financial resource strain: Not on file  . Food insecurity    Worry: Not on file    Inability: Not on file  . Transportation needs    Medical: Not on file    Non-medical: Not on file  Tobacco Use  . Smoking status: Never Smoker  . Smokeless tobacco: Never Used  Substance and Sexual Activity  . Alcohol use: Not on file  . Drug use: Not on file  . Sexual activity: Not on file  Lifestyle  . Physical activity    Days per week: Not on file    Minutes per session: Not on file  . Stress: Not on file  Relationships  . Social Musician on phone: Not on file    Gets together: Not on file    Attends religious service: Not on file    Active member of club or organization: Not on file    Attends meetings of clubs or organizations: Not on file    Relationship status: Not on file  Other Topics Concern  . Not on file  Social History  Narrative  . Not on file   Family History: No family history on file. Allergies: No Known Allergies Medications: See med rec.  Review of Systems: No headache, visual changes, nausea, vomiting, diarrhea, constipation, dizziness, abdominal pain, skin rash, fevers, chills, night sweats, swollen lymph nodes, weight loss, chest pain, body aches, joint swelling, muscle aches, shortness of breath, mood changes, visual or auditory hallucinations.  Objective:    General: Well Developed, well nourished, and in no acute distress.  Neuro: Alert and oriented x3, extra-ocular muscles intact, sensation grossly intact. Cranial nerves II through XII are intact, motor, sensory, and coordinative functions are all intact. HEENT: Normocephalic, atraumatic, pupils equal round reactive to light, neck supple, no masses, no lymphadenopathy, thyroid nonpalpable. Oropharynx, nasopharynx, external ear canals are unremarkable. Skin: Warm and dry, no rashes noted.  Cardiac: Regular rate and rhythm, no murmurs rubs or gallops.  Respiratory: Clear to auscultation bilaterally. Not using accessory muscles, speaking in full sentences.  Abdominal: Soft, nontender, nondistended, positive bowel sounds, no masses, no organomegaly.  Musculoskeletal: Shoulder, elbow, wrist, hip, knee, ankle stable, and with full range of motion.  Impression and Recommendations:    The patient was counselled, risk factors were discussed, anticipatory guidance given.  Annual physical exam Routine physical as above, Tdap  today. He is going to get his flu shot elsewhere and let us know.  Chronic periscapular pain on right side I think the is referred pain from his cervical spine. It does radiate to the anterior chest. Adding cervical spine, chest x-ray, d-dimer. He will hold off on rows for now. Cervical spine and rhomboid rehab exercises given.  Irritable bowel syndrome with diarrhea Good amount of fiber intake. This does not bother him  enough to consider Levbid treatment.  Mixed hyperlipidemia Starting low-dose atorvastatin, recheck in 3 months.   ___________________________________________ Gwen Her. Dianah Field, M.D., ABFM., CAQSM. Primary Care and Sports Medicine [facility]  Adjunct Professor of [employer]

## 2018-12-14 NOTE — Assessment & Plan Note (Signed)
Routine physical as above, Tdap today. He is going to get his flu shot elsewhere and let us know.

## 2018-12-14 NOTE — Assessment & Plan Note (Signed)
Good amount of fiber intake. This does not bother him enough to consider Levbid treatment.

## 2018-12-29 DIAGNOSIS — E782 Mixed hyperlipidemia: Secondary | ICD-10-CM | POA: Insufficient documentation

## 2018-12-29 LAB — LIPID PANEL W/REFLEX DIRECT LDL
Cholesterol: 245 mg/dL — ABNORMAL HIGH (ref <200)
HDL: 54 mg/dL (ref >=40)
LDL Cholesterol (Calc): 167 mg/dL (calc) — ABNORMAL HIGH
Non-HDL Cholesterol (Calc): 191 mg/dL (calc) — ABNORMAL HIGH (ref <130)
Total CHOL/HDL Ratio: 4.5 (calc) (ref <5.0)
Triglycerides: 113 mg/dL (ref <150)

## 2018-12-29 LAB — CBC
HCT: 49.4 % (ref 38.5–50.0)
Hemoglobin: 17.1 g/dL (ref 13.2–17.1)
MCH: 33.2 pg — ABNORMAL HIGH (ref 27.0–33.0)
MCHC: 34.6 g/dL (ref 32.0–36.0)
MCV: 95.9 fL (ref 80.0–100.0)
MPV: 10.8 fL (ref 7.5–12.5)
Platelets: 206 10*3/uL (ref 140–400)
RBC: 5.15 10*6/uL (ref 4.20–5.80)
RDW: 12.5 % (ref 11.0–15.0)
WBC: 6.8 10*3/uL (ref 3.8–10.8)

## 2018-12-29 LAB — PSA, TOTAL AND FREE
PSA, % Free: UNDETERMINED % (calc) (ref >25)
PSA, Free: <0.1 ng/mL
PSA, Total: 0.1 ng/mL (ref <=4.0)

## 2018-12-29 LAB — COMPLETE METABOLIC PANEL WITH GFR
AG Ratio: 1.8 (calc) (ref 1.0–2.5)
ALT: 22 U/L (ref 9–46)
AST: 19 U/L (ref 10–40)
Albumin: 4.7 g/dL (ref 3.6–5.1)
Alkaline phosphatase (APISO): 52 U/L (ref 36–130)
BUN: 13 mg/dL (ref 7–25)
CO2: 27 mmol/L (ref 20–32)
Calcium: 9.7 mg/dL (ref 8.6–10.3)
Chloride: 103 mmol/L (ref 98–110)
Creat: 0.85 mg/dL (ref 0.60–1.35)
GFR, Est African American: 130 mL/min/{1.73_m2} (ref >=60)
GFR, Est Non African American: 112 mL/min/{1.73_m2} (ref >=60)
Globulin: 2.6 g/dL (calc) (ref 1.9–3.7)
Glucose, Bld: 91 mg/dL (ref 65–99)
Potassium: 3.7 mmol/L (ref 3.5–5.3)
Sodium: 137 mmol/L (ref 135–146)
Total Bilirubin: 1 mg/dL (ref 0.2–1.2)
Total Protein: 7.3 g/dL (ref 6.1–8.1)

## 2018-12-29 LAB — HEMOGLOBIN A1C
Hgb A1c MFr Bld: 4.6 % of total Hgb (ref <5.7)
Mean Plasma Glucose: 85 (calc)
eAG (mmol/L): 4.7 (calc)

## 2018-12-29 LAB — VITAMIN D 25 HYDROXY (VIT D DEFICIENCY, FRACTURES): Vit D, 25-Hydroxy: 31 ng/mL (ref 30–100)

## 2018-12-29 LAB — D-DIMER, QUANTITATIVE: D-Dimer, Quant: <0.19 mcg/mL FEU (ref <0.50)

## 2018-12-29 LAB — TSH: TSH: 1.71 mIU/L (ref 0.40–4.50)

## 2018-12-29 MED ORDER — ATORVASTATIN CALCIUM 10 MG PO TABS: 10.0000 mg | ORAL_TABLET | Freq: Every day | ORAL | 3 refills | Status: DC

## 2018-12-29 NOTE — Assessment & Plan Note (Signed)
Starting low-dose atorvastatin, recheck in 3 months.

## 2018-12-29 NOTE — Addendum Note (Signed)
Addended by: Silverio Decamp on: 12/29/2018 05:51 PM   Modules accepted: Orders

## 2019-03-30 ENCOUNTER — Other Ambulatory Visit: Payer: Self-pay | Admitting: Sports Medicine

## 2019-03-30 DIAGNOSIS — E782 Mixed hyperlipidemia: Secondary | ICD-10-CM

## 2019-04-16 ENCOUNTER — Other Ambulatory Visit: Payer: Self-pay | Admitting: Sports Medicine

## 2019-06-17 ENCOUNTER — Other Ambulatory Visit: Payer: Self-pay | Admitting: Sports Medicine

## 2019-06-17 DIAGNOSIS — E782 Mixed hyperlipidemia: Secondary | ICD-10-CM

## 2019-08-24 ENCOUNTER — Telehealth: Payer: Self-pay | Admitting: Sports Medicine

## 2019-08-24 MED ORDER — SCOPOLAMINE 1 MG/3DAYS TD PT72: 1.0000 | MEDICATED_PATCH | TRANSDERMAL | 3 refills | Status: DC

## 2019-08-24 NOTE — Telephone Encounter (Signed)
Brett Hoffman is going on a trip and needs some scopolamine.

## 2019-08-29 ENCOUNTER — Other Ambulatory Visit: Payer: Self-pay | Admitting: Sports Medicine

## 2019-09-18 ENCOUNTER — Ambulatory Visit: Payer: 59 | Admitting: Medical-Surgical

## 2019-09-26 ENCOUNTER — Ambulatory Visit: Payer: 59 | Admitting: Sports Medicine

## 2019-10-04 ENCOUNTER — Encounter: Payer: Self-pay | Admitting: Sports Medicine

## 2019-10-04 ENCOUNTER — Ambulatory Visit (INDEPENDENT_AMBULATORY_CARE_PROVIDER_SITE_OTHER): Payer: 59 | Admitting: Sports Medicine

## 2019-10-04 VITALS — BP 108/78 | HR 109 | Ht 73.0 in | Wt 233.0 lb

## 2019-10-04 DIAGNOSIS — R1013 Epigastric pain: Secondary | ICD-10-CM | POA: Diagnosis not present

## 2019-10-04 MED ORDER — ESOMEPRAZOLE MAGNESIUM 40 MG PO CPDR: 40.0000 mg | DELAYED_RELEASE_CAPSULE | Freq: Two times a day (BID) | ORAL | 3 refills | Status: DC

## 2019-10-04 NOTE — Progress Notes (Signed)
    Procedures performed today:    None.  Independent interpretation of notes and tests performed by another provider:   None.  Brief History, Exam, Impression, and Recommendations:    Dyspepsia This is a pleasant 39 year old male, he for the past several weeks has had increasing epigastric pain, without any overt signs of GI hemorrhage. Today we obtained an H. pylori test, he will go ahead and start Nexium 40 mg twice daily. We will do this for 6 weeks and then reevaluate.    ___________________________________________ Ihor Austin. Benjamin Stain, M.D., ABFM., CAQSM. Primary Care and Sports Medicine  [facility]  Adjunct Instructor of Family Medicine  [employer]

## 2019-10-04 NOTE — Assessment & Plan Note (Signed)
This is a pleasant 38 year old male, he for the past several weeks has had increasing epigastric pain, without any overt signs of GI hemorrhage. Today we obtained an H. pylori test, he will go ahead and start Nexium 40 mg twice daily. We will do this for 6 weeks and then reevaluate.

## 2019-10-05 LAB — H. PYLORI BREATH TEST: H. pylori Breath Test: NOT DETECTED

## 2019-10-19 ENCOUNTER — Other Ambulatory Visit: Payer: Self-pay | Admitting: Sports Medicine

## 2019-10-22 ENCOUNTER — Other Ambulatory Visit: Payer: Self-pay | Admitting: Sports Medicine

## 2019-11-02 ENCOUNTER — Encounter: Payer: Self-pay | Admitting: Nurse Practitioner

## 2019-11-02 ENCOUNTER — Ambulatory Visit (INDEPENDENT_AMBULATORY_CARE_PROVIDER_SITE_OTHER): Payer: 59

## 2019-11-02 ENCOUNTER — Telehealth: Payer: Self-pay

## 2019-11-02 ENCOUNTER — Other Ambulatory Visit: Payer: Self-pay

## 2019-11-02 ENCOUNTER — Ambulatory Visit (INDEPENDENT_AMBULATORY_CARE_PROVIDER_SITE_OTHER): Payer: 59 | Admitting: Nurse Practitioner

## 2019-11-02 VITALS — BP 132/88 | HR 98 | Temp 98.4°F | Ht 72.0 in | Wt 232.5 lb

## 2019-11-02 DIAGNOSIS — J189 Pneumonia, unspecified organism: Secondary | ICD-10-CM | POA: Diagnosis not present

## 2019-11-02 DIAGNOSIS — R000 Tachycardia, unspecified: Secondary | ICD-10-CM | POA: Diagnosis not present

## 2019-11-02 DIAGNOSIS — J209 Acute bronchitis, unspecified: Secondary | ICD-10-CM

## 2019-11-02 MED ORDER — ADVAIR HFA 45-21 MCG/ACT IN AERO: 2.0000 | INHALATION_SPRAY | Freq: Two times a day (BID) | RESPIRATORY_TRACT | 12 refills | Status: DC

## 2019-11-02 NOTE — Telephone Encounter (Signed)
Pt called requesting if his lab results are available for review.

## 2019-11-02 NOTE — Telephone Encounter (Signed)
The final result of the chest xray just came across my computer. The pneumonia looks to have cleared. I will call in the steroid inhaler for him for suspected bronchitis and inflammation residual from the infection. Other labs were good- his hemoglobin was a little elevated, but that could be from the recent illness and is nothing to worry about.

## 2019-11-02 NOTE — Patient Instructions (Signed)
When we get the results back from the chest x-ray, someone will call you to let you know if there is improvement or worsening of the lungs.   If there is improvement, we can add an inhaled corticosteroid to your daily medication to see if this helps with the residual shortness of breath  If there is no improvement, we will try another antibiotic and plan to get a sputum culture to make sure that this is not something fungal we are dealing with.    Community-Acquired Pneumonia, Adult Pneumonia is an infection of the lungs. It causes swelling in the airways of the lungs. Mucus and fluid may also build up inside the airways. One type of pneumonia can happen while a person is in a hospital. A different type can happen when a person is not in a hospital (community-acquired pneumonia).  What are the causes?  This condition is caused by germs (viruses, bacteria, or fungi). Some types of germs can be passed from one person to another. This can happen when you breathe in droplets from the cough or sneeze of an infected person. What increases the risk? You are more likely to develop this condition if you:  Have a long-term (chronic) disease, such as: ? Chronic obstructive pulmonary disease (COPD). ? Asthma. ? Cystic fibrosis. ? Congestive heart failure. ? Diabetes. ? Kidney disease.  Have HIV.  Have sickle cell disease.  Have had your spleen removed.  Do not take good care of your teeth and mouth (poor dental hygiene).  Have a medical condition that increases the risk of breathing in droplets from your own mouth and nose.  Have a weakened body defense system (immune system).  Are a smoker.  Travel to areas where the germs that cause this illness are common.  Are around certain animals or the places they live. What are the signs or symptoms?  A dry cough.  A wet (productive) cough.  Fever.  Sweating.  Chest pain. This often happens when breathing deeply or coughing.  Fast  breathing or trouble breathing.  Shortness of breath.  Shaking chills.  Feeling tired (fatigue).  Muscle aches. How is this treated? Treatment for this condition depends on many things. Most adults can be treated at home. In some cases, treatment must happen in a hospital. Treatment may include:  Medicines given by mouth or through an IV tube.  Being given extra oxygen.  Respiratory therapy. In rare cases, treatment for very bad pneumonia may include:  Using a machine to help you breathe.  Having a procedure to remove fluid from around your lungs. Follow these instructions at home: Medicines  Take over-the-counter and prescription medicines only as told by your doctor. ? Only take cough medicine if you are losing sleep.  If you were prescribed an antibiotic medicine, take it as told by your doctor. Do not stop taking the antibiotic even if you start to feel better. General instructions   Sleep with your head and neck raised (elevated). You can do this by sleeping in a recliner or by putting a few pillows under your head.  Rest as needed. Get at least 8 hours of sleep each night.  Drink enough water to keep your pee (urine) pale yellow.  Eat a healthy diet that includes plenty of vegetables, fruits, whole grains, low-fat dairy products, and lean protein.  Do not use any products that contain nicotine or tobacco. These include cigarettes, e-cigarettes, and chewing tobacco. If you need help quitting, ask your doctor.  Keep all follow-up visits as told by your doctor. This is important. How is this prevented? A shot (vaccine) can help prevent pneumonia. Shots are often suggested for:  People older than 37 years of age.  People older than 37 years of age who: ? Are having cancer treatment. ? Have long-term (chronic) lung disease. ? Have problems with their body's defense system. You may also prevent pneumonia if you take these actions:  Get the flu (influenza) shot  every year.  Go to the dentist as often as told.  Wash your hands often. If you cannot use soap and water, use hand sanitizer. Contact a doctor if:  You have a fever.  You lose sleep because your cough medicine does not help. Get help right away if:  You are short of breath and it gets worse.  You have more chest pain.  Your sickness gets worse. This is very serious if: ? You are an older adult. ? Your body's defense system is weak.  You cough up blood. Summary  Pneumonia is an infection of the lungs.  Most adults can be treated at home. Some will need treatment in a hospital.  Drink enough water to keep your pee pale yellow.  Get at least 8 hours of sleep each night. This information is not intended to replace advice given to you by your health care provider. Make sure you discuss any questions you have with your health care provider. Document Revised: 06/29/2018 Document Reviewed: 11/04/2017 Elsevier Patient Education  2020 Else[employer] Inc[employer]   Acute Bronchitis, Adult  Acute bronchitis is sudden or acute swelling of the air tubes (bronchi) in the lungs. Acute bronchitis causes these tubes to fill with mucus, which can make it hard to breathe. It can also cause coughing or wheezing. In adults, acute bronchitis usually goes away within 2 weeks. A cough caused by bronchitis may last up to 3 weeks. Smoking, allergies, and asthma can make the condition worse. What are the causes? This condition can be caused by germs and by substances that irritate the lungs, including:  Cold and flu viruses. The most common cause of this condition is the virus that causes the common cold.  Bacteria.  Substances that irritate the lungs, including: ? Smoke from cigarettes and other forms of tobacco. ? Dust and pollen. ? Fumes from chemical products, gases, or burned fuel. ? Other materials that pollute indoor or outdoor air.  Close contact with someone who has acute bronchitis. What  increases the risk? The following factors may make you more likely to develop this condition:  A weak body's defense system, also called the immune system.  A condition that affects your lungs and breathing, such as asthma. What are the signs or symptoms? Common symptoms of this condition include:  Lung and breathing problems, such as: ? Coughing. This may bring up clear, yellow, or green mucus from your lungs (sputum). ? Wheezing. ? Having too much mucus in your lungs (chest congestion). ? Having shortness of breath.  A fever.  Chills.  Aches and pains, including: ? Tightness in your chest and other body aches. ? A sore throat. How is this diagnosed? This condition is usually diagnosed based on:  Your symptoms and medical history.  A physical exam. You may also have other tests, including tests to rule out other conditions, such pneumonia. These tests include:  A test of lung function.  Test of a mucus sample to look for the presence of bacteria.  Tests to check  the oxygen level in your blood.  Blood tests.  Chest X-ray. How is this treated? Most cases of acute bronchitis clear up over time without treatment. Your health care provider may recommend:  Drinking more fluids. This can thin your mucus, which may improve your breathing.  Taking a medicine for a fever or cough.  Using a device that gets medicine into your lungs (inhaler) to help improve breathing and control coughing.  Using a vaporizer or a humidifier. These are machines that add water to the air to help you breathe better. Follow these instructions at home: Activity  Get plenty of rest.  Return to your normal activities as told by your health care provider. Ask your health care provider what activities are safe for you. Lifestyle  Drink enough fluid to keep your urine pale yellow.  Do not drink alcohol.  Do not use any products that contain nicotine or tobacco, such as cigarettes,  e-cigarettes, and chewing tobacco. If you need help quitting, ask your health care provider. Be aware that: ? Your bronchitis will get worse if you smoke or breathe in other people's smoke (secondhand smoke). ? Your lungs will heal faster if you quit smoking. General instructions   Take over-the-counter and prescription medicines only as told by your health care provider.  Use an inhaler, vaporizer, or humidifier as told by your health care provider.  If you have a sore throat, gargle with a salt-water mixture 3-4 times a day or as needed. To make a salt-water mixture, completely dissolve -1 tsp (3-6 g) of salt in 1 cup (237 mL) of warm water.  Keep all follow-up visits as told by your health care provider. This is important. How is this prevented? To lower your risk of getting this condition again:  Wash your hands often with soap and water. If soap and water are not available, use hand sanitizer.  Avoid contact with people who have cold symptoms.  Try not to touch your mouth, nose, or eyes with your hands.  Avoid places where there are fumes from chemicals. Breathing these fumes will make your condition worse.  Get the flu shot every year. Contact a health care provider if:  Your symptoms do not improve after 2 weeks of treatment.  You vomit more than once or twice.  You have symptoms of dehydration such as: ? Dark urine. ? Dry skin or eyes. ? Increased thirst. ? Headaches. ? Confusion. ? Muscle cramps. Get help right away if you:  Cough up blood.  Feel pain in your chest.  Have severe shortness of breath.  Faint or keep feeling like you are going to faint.  Have a severe headache.  Have fever or chills that get worse. These symptoms may represent a serious problem that is an emergency. Do not wait to see if the symptoms will go away. Get medical help right away. Call your local emergency services (911 in the [place] Do not drive yourself to the  hospital. Summary  Acute bronchitis is sudden (acute) inflammation of the air tubes (bronchi) between the windpipe and the lungs. In adults, acute bronchitis usually goes away within 2 weeks, although coughing may last 3 weeks or longer  Take over-the-counter and prescription medicines only as told by your health care provider.  Drink enough fluid to keep your urine pale yellow.  Contact a health care provider if your symptoms do not improve after 2 weeks of treatment.  Get help right away if you cough up blood, faint, or  have chest pain or shortness of breath. This information is not intended to replace advice given to you by your health care provider. Make sure you discuss any questions you have with your health care provider. Document Revised: 11/21/2018 Document Reviewed: 09/30/2018 Elsevier Patient Education  2020 [employer].

## 2019-11-02 NOTE — Progress Notes (Signed)
Established Patient Office Visit  Subjective:  Patient ID: Brett Hoffman, male    DOB: 08/02/1980  Age: 39 y.o. MRN: [id number]  CC: No chief complaint on file.   HPI Brett Hoffman "Brett Hoffman" presents for follow-up for bilateral lower lobe pneumonia diagnosed at Urgent Care on 10/05/2019. He has images of his chest x-ray with him today on his phone.   He took one full round of doxycycline with limited improvement. He was then started on  Augmentin and Azithromycin when chest x-ray failed to clear and symptoms continued. He is here today for follow-up evaluation and repeat chest x-ray.  He reports he is feeling better, however, he continues to have a non-productive cough and feelings as though he cannot take a deep breath. He reports that he feels is his able to do more than before, but continues to have residual tightness in his chest with breathing fairly often and "just feel I can't get a good breath".   He denies fever, chills, nausea, vomiting, sore throat, ear pain, rhinorrhea, or sinus symptoms.  He denies dizziness, lightheadedness, headache, numbness or tingling in his extremities, palpitations, or chest pain.   No past medical history on file.  No past surgical history on file.  No family history on file.  Social History   Socioeconomic History  . Marital status: Married    Spouse name: Not on file  . Number of children: Not on file  . Years of education: Not on file  . Highest education level: Not on file  Occupational History  . Not on file  Tobacco Use  . Smoking status: Never Smoker  . Smokeless tobacco: Never Used  Substance and Sexual Activity  . Alcohol use: Not on file  . Drug use: Not on file  . Sexual activity: Not on file  Other Topics Concern  . Not on file  Social History Narrative  . Not on file   Social Determinants of Health   Financial Resource Strain:   . Difficulty of Paying Living Expenses:   Food Insecurity:   . Worried About Community education officer in the Last Year:   . Barista in the Last Year:   Transportation Needs:   . Freight forwarder (Medical):   Marland Kitchen Lack of Transportation (Non-Medical):   Physical Activity:   . Days of Exercise per Week:   . Minutes of Exercise per Session:   Stress:   . Feeling of Stress :   Social Connections:   . Frequency of Communication with Friends and Family:   . Frequency of Social Gatherings with Friends and Family:   . Attends Religious Services:   . Active Member of Clubs or Organizations:   . Attends Banker Meetings:   Marland Kitchen Marital Status:   Intimate Partner Violence:   . Fear of Current or Ex-Partner:   . Emotionally Abused:   Marland Kitchen Physically Abused:   . Sexually Abused:     Outpatient Medications Prior to Visit  Medication Sig Dispense Refill  . albuterol (VENTOLIN HFA) 108 (90 Base) MCG/ACT inhaler Inhale 2 puffs into the lungs every 6 (six) hours as needed for wheezing. 2 Inhaler 11  . esomeprazole (NEXIUM) 40 MG capsule Take 1 capsule (40 mg total) by mouth 2 (two) times daily before a meal. 60 capsule 3  . loratadine (CLARITIN) 10 MG tablet Take 1 tablet (10 mg total) by mouth daily. 90 tablet 3  . montelukast (SINGULAIR) 10 MG tablet TAKE 1 TABLET  BY MOUTH EVERY DAY 90 tablet 90  . pravastatin (PRAVACHOL) 40 MG tablet Take 1 tablet (40 mg total) by mouth daily. 90 tablet 3  . TRINTELLIX 20 MG TABS tablet TAKE 1 TABLET BY MOUTH EVERY DAY 90 tablet 1   No facility-administered medications prior to visit.    No Known Allergies    Objective:    Physical Exam Vitals and nursing note reviewed.  Constitutional:      Appearance: Normal appearance. He is normal weight.  HENT:     Head: Normocephalic.  Eyes:     Extraocular Movements: Extraocular movements intact.     Conjunctiva/sclera: Conjunctivae normal.     Pupils: Pupils are equal, round, and reactive to light.  Neck:     Vascular: No carotid bruit.  Cardiovascular:     Rate and Rhythm:  Regular rhythm. Tachycardia present.  No extrasystoles are present.    Chest Wall: PMI is not displaced.     Pulses: Normal pulses. No decreased pulses.     Heart sounds: Normal heart sounds.  Pulmonary:     Effort: Pulmonary effort is normal.     Breath sounds: Normal breath sounds. No decreased breath sounds, wheezing, rhonchi or rales.  Abdominal:     General: Abdomen is flat. Bowel sounds are normal.     Palpations: Abdomen is soft.  Musculoskeletal:        General: Normal range of motion.     Cervical back: Normal range of motion.     Right lower leg: No edema.     Left lower leg: No edema.  Skin:    General: Skin is warm and dry.     Capillary Refill: Capillary refill takes less than 2 seconds.  Neurological:     General: No focal deficit present.     Mental Status: He is alert and oriented to person, place, and time.     There were no vitals taken for this visit. Wt Readings from Last 3 Encounters:  10/04/19 233 lb (105.7 kg)  12/14/18 229 lb (103.9 kg)  10/12/18 224 lb (101.6 kg)     Health Maintenance Due  Topic Date Due  . Hepatitis C Screening  Never done  . COVID-19 Vaccine (1) Never done  . INFLUENZA VACCINE  10/22/2019    There are no preventive care reminders to display for this patient.  Lab Results  Component Value Date   TSH 1.71 12/28/2018   Lab Results  Component Value Date   WBC 6.8 12/28/2018   HGB 17.1 12/28/2018   HCT 49.4 12/28/2018   MCV 95.9 12/28/2018   PLT 206 12/28/2018   Lab Results  Component Value Date   NA 137 12/28/2018   K 3.7 12/28/2018   CO2 27 12/28/2018   GLUCOSE 91 12/28/2018   BUN 13 12/28/2018   CREATININE 0.85 12/28/2018   BILITOT 1.0 12/28/2018   AST 19 12/28/2018   ALT 22 12/28/2018   PROT 7.3 12/28/2018   CALCIUM 9.7 12/28/2018   Lab Results  Component Value Date   CHOL 245 (H) 12/28/2018   Lab Results  Component Value Date   HDL 54 12/28/2018   Lab Results  Component Value Date   LDLCALC 167  (H) 12/28/2018   Lab Results  Component Value Date   TRIG 113 12/28/2018   Lab Results  Component Value Date   CHOLHDL 4.5 12/28/2018   Lab Results  Component Value Date   HGBA1C 4.6 12/28/2018  Assessment & Plan:   1. Pneumonia of both lower lobes due to infectious organism His lungs sound clear today and he is not tachypnic, which is reassuring. No evidence of cardiac arrhythmia. No evidence of ongoing infection.  Suspect continued shortness of breath may be due to continued infammation in the lungs after recent illness.  Will obtain a follow-up CXR today and CBC to rule out signs of ongoing infection.  If CXR clear, discussed the option to start inhaled corticosteroid with LABA for ongoing symptoms. If CXR is not clear, will consider levofloxacin as treatment for ongoing pneumonia- he may need ICS treatment with this, as well.  Discussed the plan with patient- he is agreeable. Decision on how to proceed will be made once results have been evaluated from CBC and CXR.  - DG Chest 2 View; Future - CBC with Differential  2. Tachycardia Patient is tachycardic today with a resting heart rate in the 90's. He reports that this is his baseline for as long as he can rememer. He does have a personal history of thryroid nodule. Last TSH about a year ago was normal. Given the ongoing elevated HR we will recheck today. Will also monitor for signs of anemia.  - CBC with Differential - TSH(Reflex)  Follow-up if symptoms worsen or fail to improve.   Tollie Eth, NP

## 2019-11-03 LAB — CBC WITH DIFFERENTIAL/PLATELET
Absolute Monocytes: 477 cells/uL (ref 200–950)
Basophils Absolute: 39 cells/uL (ref 0–200)
Basophils Relative: 0.9 %
Eosinophils Absolute: 138 cells/uL (ref 15–500)
Eosinophils Relative: 3.2 %
HCT: 49.6 % (ref 38.5–50.0)
Hemoglobin: 17.4 g/dL — ABNORMAL HIGH (ref 13.2–17.1)
Lymphs Abs: 1664 cells/uL (ref 850–3900)
MCH: 33.3 pg — ABNORMAL HIGH (ref 27.0–33.0)
MCHC: 35.1 g/dL (ref 32.0–36.0)
MCV: 95 fL (ref 80.0–100.0)
MPV: 10.7 fL (ref 7.5–12.5)
Monocytes Relative: 11.1 %
Neutro Abs: 1982 cells/uL (ref 1500–7800)
Neutrophils Relative %: 46.1 %
Platelets: 217 10*3/uL (ref 140–400)
RBC: 5.22 10*6/uL (ref 4.20–5.80)
RDW: 12.8 % (ref 11.0–15.0)
Total Lymphocyte: 38.7 %
WBC: 4.3 10*3/uL (ref 3.8–10.8)

## 2019-11-03 LAB — REFLEX TIQ

## 2019-11-03 LAB — TSH(REFL): TSH: 1.53 mIU/L (ref 0.40–4.50)

## 2019-11-03 NOTE — Telephone Encounter (Signed)
Task completed. Pt has been updated of his results and rx sent to the pharmacy. Verbalized understanding. No other inquiries during the call.

## 2019-11-03 NOTE — Progress Notes (Signed)
TSH was normal. WBC look great, no signs of continued infection. I called in the inhaler to the pharmacy. If still having symptoms next week, let us know.

## 2019-12-19 ENCOUNTER — Other Ambulatory Visit: Payer: Self-pay | Admitting: Sports Medicine

## 2019-12-19 DIAGNOSIS — E782 Mixed hyperlipidemia: Secondary | ICD-10-CM

## 2020-01-26 ENCOUNTER — Other Ambulatory Visit: Payer: Self-pay | Admitting: Sports Medicine

## 2020-01-26 DIAGNOSIS — R1013 Epigastric pain: Secondary | ICD-10-CM

## 2020-02-02 ENCOUNTER — Encounter: Payer: Self-pay | Admitting: Sports Medicine

## 2020-02-02 ENCOUNTER — Ambulatory Visit (INDEPENDENT_AMBULATORY_CARE_PROVIDER_SITE_OTHER): Payer: 59 | Admitting: Sports Medicine

## 2020-02-02 ENCOUNTER — Other Ambulatory Visit: Payer: Self-pay

## 2020-02-02 DIAGNOSIS — E041 Nontoxic single thyroid nodule: Secondary | ICD-10-CM

## 2020-02-02 DIAGNOSIS — R198 Other specified symptoms and signs involving the digestive system and abdomen: Secondary | ICD-10-CM | POA: Diagnosis not present

## 2020-02-02 DIAGNOSIS — R09A2 Foreign body sensation, throat: Secondary | ICD-10-CM | POA: Insufficient documentation

## 2020-02-02 DIAGNOSIS — R0989 Other specified symptoms and signs involving the circulatory and respiratory systems: Secondary | ICD-10-CM

## 2020-02-02 MED ORDER — ACAMPROSATE CALCIUM 333 MG PO TBEC: 666.0000 mg | DELAYED_RELEASE_TABLET | Freq: Three times a day (TID) | ORAL | 11 refills | Status: DC

## 2020-02-02 NOTE — Progress Notes (Addendum)
    Procedures performed today:    None.  Independent interpretation of notes and tests performed by another provider:   None.  Brief History, Exam, Impression, and Recommendations:    Globus sensation Exam was overall unremarkable, Brett Hoffman on has been undergoing some tremendous stressors with his son. Overall anxiety is well controlled, but I do suspect that the current life changes are contributing. I would like ENT to weigh in, flexible laryngoscopy. He will continue his PPI and he will come back on his alcohol from occasionally 3 bottles of wine per night. I am also going to add some acamprosate to help, we can switch to disulfiram if no improvement in a month.  Thyroid nodule Repeat thyroid ultrasound.  Benign nodules, no significant change in size from prior ultrasound, no additional follow-up needed.    ___________________________________________ Brett Hoffman. Brett Hoffman, M.D., ABFM., CAQSM. Primary Care and Sports Medicine [facility]  Adjunct Instructor of Family Medicine  [employer]

## 2020-02-02 NOTE — Assessment & Plan Note (Addendum)
Exam was overall unremarkable, Brett Hoffman on has been undergoing some tremendous stressors with his son. Overall anxiety is well controlled, but I do suspect that the current life changes are contributing. I would like ENT to weigh in, flexible laryngoscopy. He will continue his PPI and he will come back on his alcohol from occasionally 3 bottles of wine per night. I am also going to add some acamprosate to help, we can switch to disulfiram if no improvement in a month.

## 2020-02-02 NOTE — Assessment & Plan Note (Addendum)
Repeat thyroid ultrasound.  Benign nodules, no significant change in size from prior ultrasound, no additional follow-up needed.

## 2020-02-05 ENCOUNTER — Ambulatory Visit (INDEPENDENT_AMBULATORY_CARE_PROVIDER_SITE_OTHER): Payer: 59

## 2020-02-05 ENCOUNTER — Other Ambulatory Visit: Payer: Self-pay

## 2020-02-05 DIAGNOSIS — E041 Nontoxic single thyroid nodule: Secondary | ICD-10-CM | POA: Diagnosis not present

## 2020-03-01 ENCOUNTER — Other Ambulatory Visit: Payer: Self-pay

## 2020-03-01 ENCOUNTER — Ambulatory Visit (INDEPENDENT_AMBULATORY_CARE_PROVIDER_SITE_OTHER): Payer: 59 | Admitting: Sports Medicine

## 2020-03-01 DIAGNOSIS — R0989 Other specified symptoms and signs involving the circulatory and respiratory systems: Secondary | ICD-10-CM

## 2020-03-01 DIAGNOSIS — R09A2 Foreign body sensation, throat: Secondary | ICD-10-CM

## 2020-03-01 DIAGNOSIS — G4719 Other hypersomnia: Secondary | ICD-10-CM

## 2020-03-01 DIAGNOSIS — R198 Other specified symptoms and signs involving the digestive system and abdomen: Secondary | ICD-10-CM | POA: Diagnosis not present

## 2020-03-01 NOTE — Assessment & Plan Note (Signed)
Ordering home sleep study 

## 2020-03-01 NOTE — Progress Notes (Signed)
    Procedures performed today:    None.  Independent interpretation of notes and tests performed by another provider:   None.  Brief History, Exam, Impression, and Recommendations:    Globus sensation Related to multiple life stressors, improved considerably with decreasing of alcohol consumption and better communication at home. Their house is under contract, so things are starting to level out.  Excessive daytime sleepiness Ordering home sleep study.  I spent 30 minutes of total time managing this patient today, this includes chart review, face to face, and non-face to face time.   ___________________________________________ Ihor Austin. Benjamin Stain, M.D., ABFM., CAQSM. Primary Care and Sports Medicine [facility]  Adjunct Instructor of Family Medicine  [employer]

## 2020-03-01 NOTE — Assessment & Plan Note (Signed)
Related to multiple life stressors, improved considerably with decreasing of alcohol consumption and better communication at home. Their house is under contract, so things are starting to level out.

## 2020-04-26 ENCOUNTER — Other Ambulatory Visit: Payer: Self-pay | Admitting: Sports Medicine

## 2020-07-21 IMAGING — US US THYROID
1 series · 13 of 25 positions shown · non-contrast
Comparison: Remote prior thyroid ultrasound 05/31/2009

CLINICAL DATA: Goiter. 37-year-old male with a history of thyroid
nodule

EXAM:
THYROID ULTRASOUND
TECHNIQUE: Ultrasound examination of the thyroid gland and adjacent soft
tissues was performed.

[Series 1: us thyroid · 0.06mm/px · 13 of 44 slices shown]
[im 1/44]
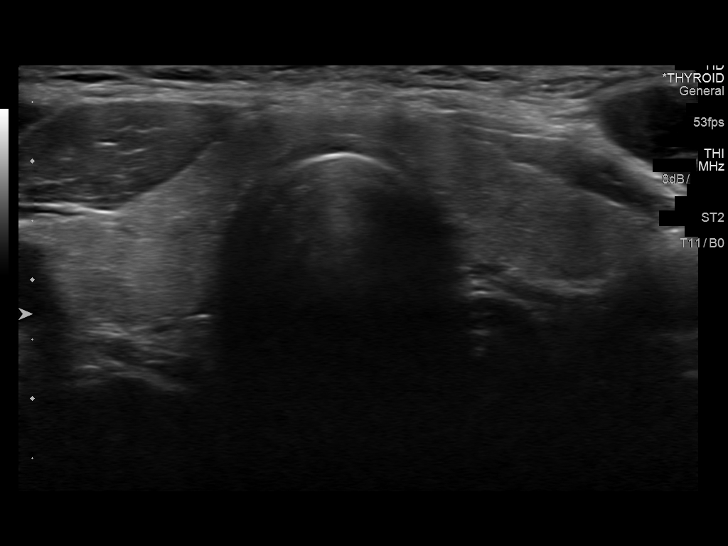
[im 4/44]
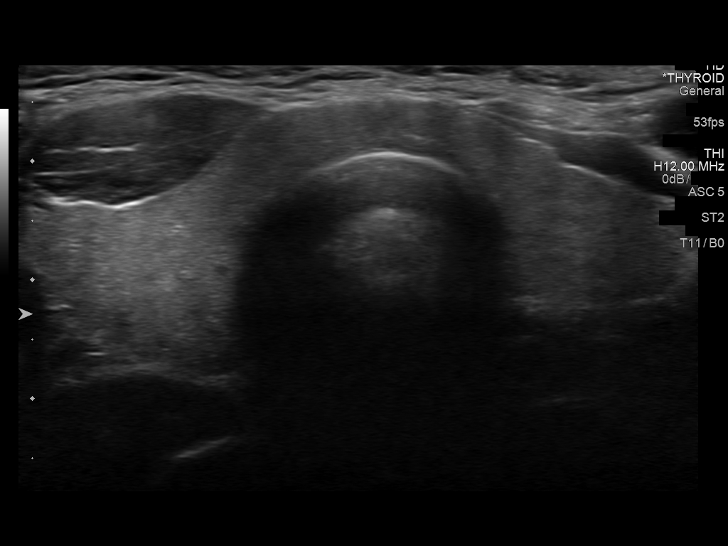
[im 8/44]
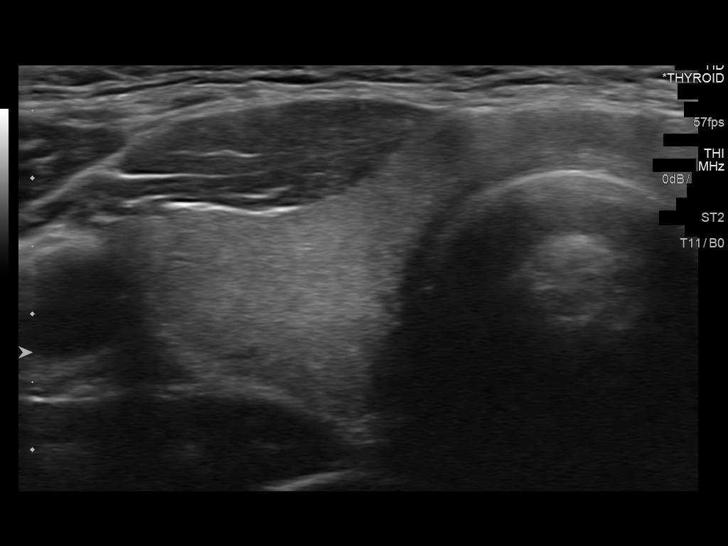
[im 11/44]
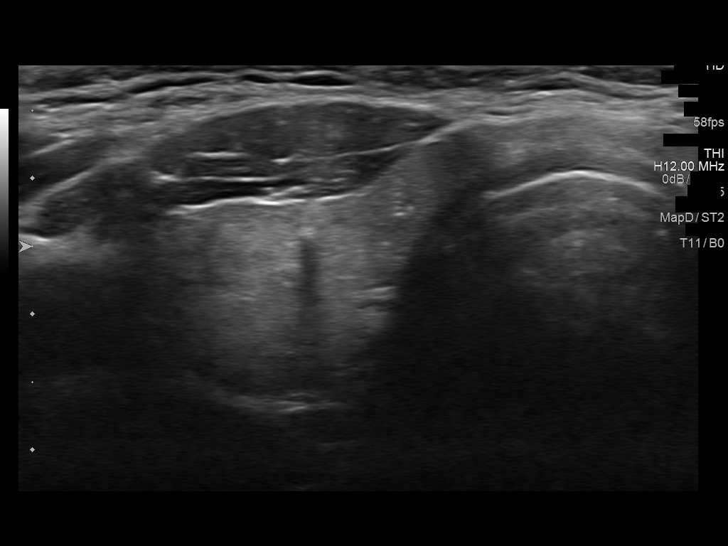
[im 15/44]
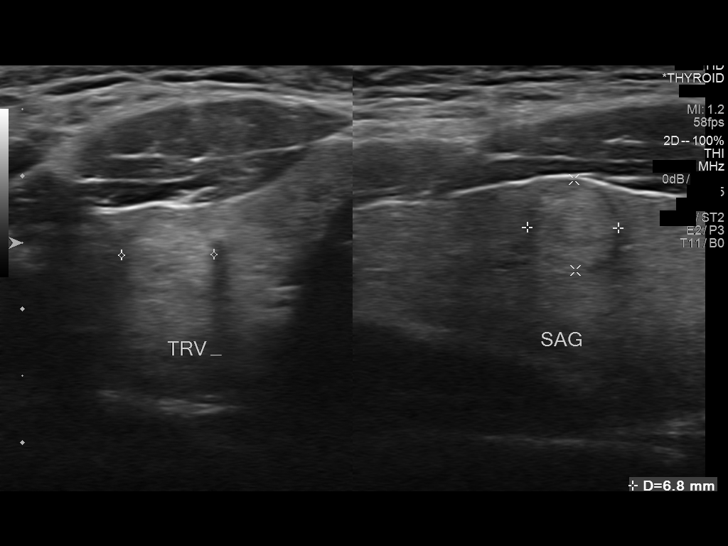
[im 18/44]
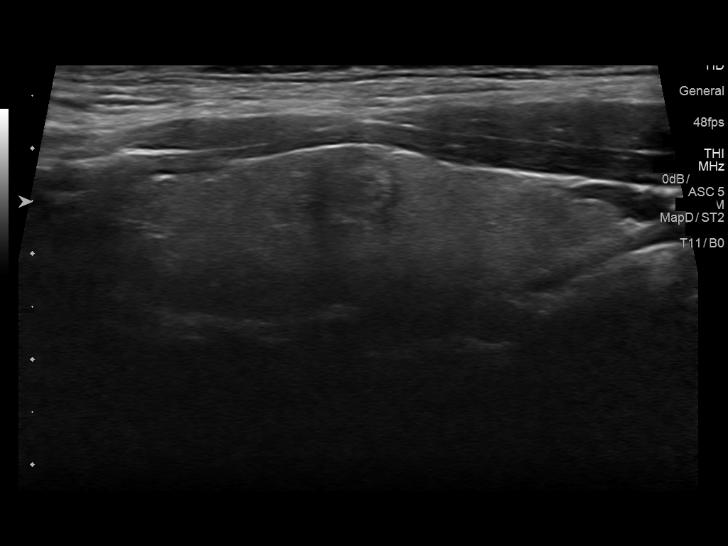
[im 22/44]
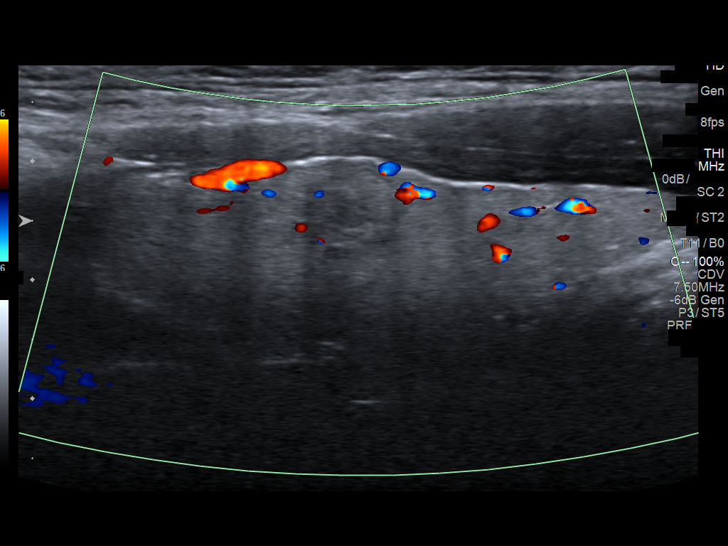
[im 26/44]
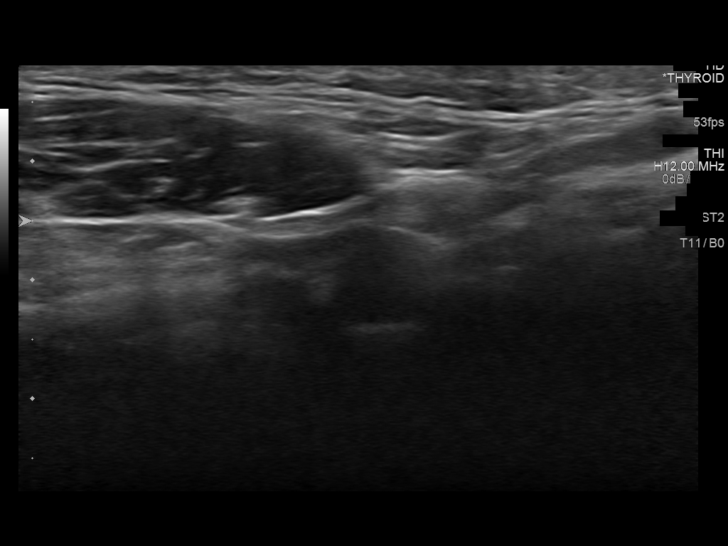
[im 29/44]
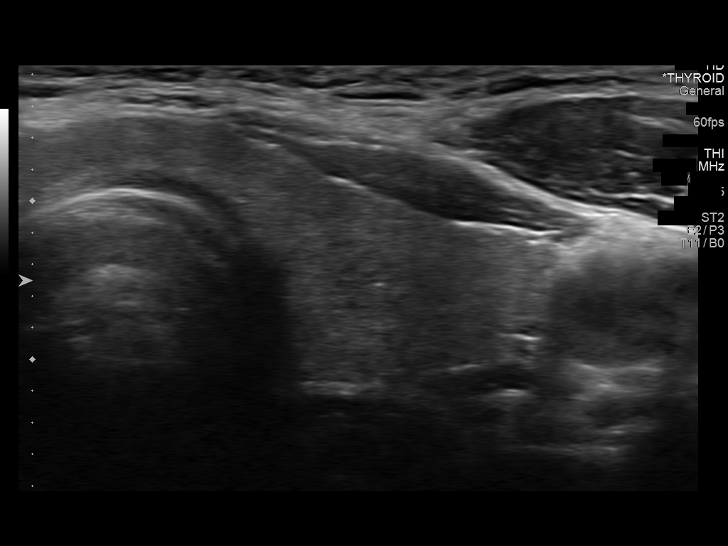
[im 33/44]
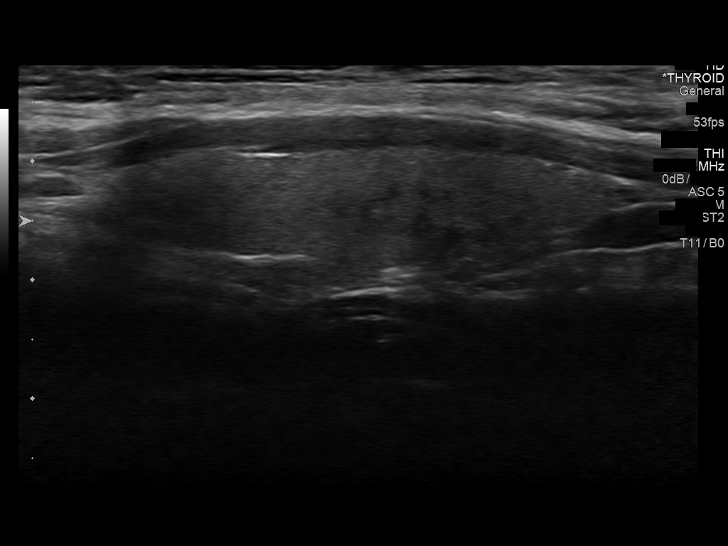
[im 36/44]
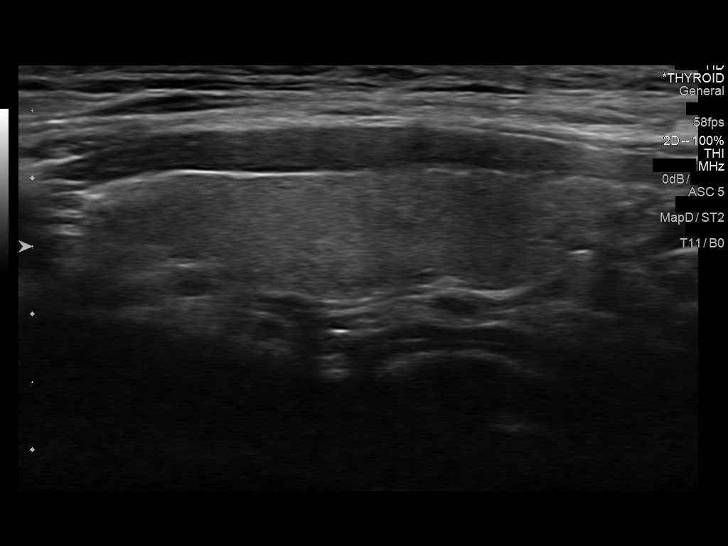
[im 40/44]
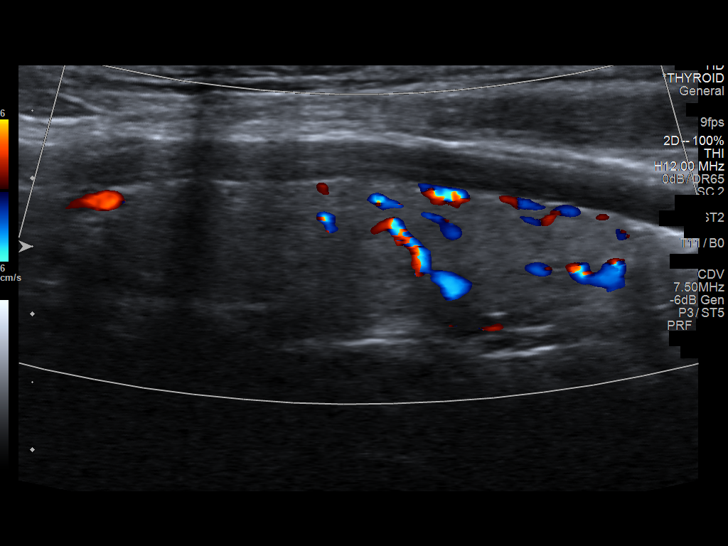
[im 44/44]
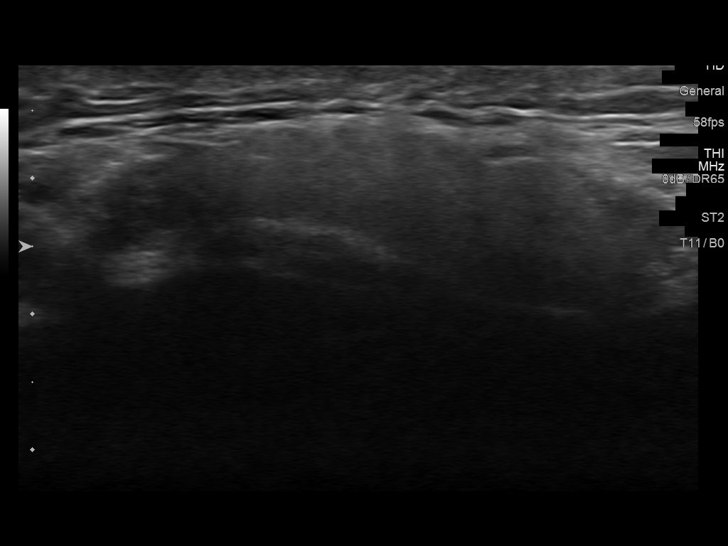

[13 of 25 positions shown; findings below may reference images not displayed]

FINDINGS: Parenchymal Echotexture: Normal

Isthmus: 0.3 cm

Right lobe: 5.7 x 1.8 x 2.2 cm

Left lobe: 4.8 x 1.2 x 1.8 cm

_________________________________________________________

Estimated total number of nodules >/= 1 cm: 0

Number of spongiform nodules >/=  2 cm not described below (TR1): 0

Number of mixed cystic and solid nodules >/= 1.5 cm not described
below (TR2): 0

_________________________________________________________

A solid echogenic nodule measuring 0.7 x 0.7 x 0.7 cm is present in
the mid aspect of the right gland. This nodule remains unchanged
compared to the prior remote imaging from Thursday May, 2009. Greater
than 5 year stability is consistent with benignity.
IMPRESSION: Greater than 8 year stability of a small subcentimeter nodule in the
right mid gland is consistent with benignity. No further follow-up
is required.

No new thyroid nodules identified.

The above is in keeping with the ACR TI-RADS recommendations - [HOSPITAL] 6279;[DATE].

## 2020-08-05 ENCOUNTER — Telehealth: Payer: Self-pay | Admitting: Sports Medicine

## 2020-08-05 DIAGNOSIS — F411 Generalized anxiety disorder: Secondary | ICD-10-CM

## 2020-08-05 MED ORDER — VORTIOXETINE HBR 10 MG PO TABS: 10.0000 mg | ORAL_TABLET | Freq: Every day | ORAL | 11 refills | Status: DC

## 2020-08-05 NOTE — Assessment & Plan Note (Signed)
Brett Hoffman and his wife Sarah are going through very difficult time with her son. On his current dose of Trintellix he seems to unemotional to his wife, in the spirits of domestic tranquility Brett Hoffman has requested that we decrease his Trintellix, I am happy to decrease down to 10 mg daily.  We can follow this up in 4 to 6 weeks. 

## 2020-08-05 NOTE — Telephone Encounter (Signed)
Brett Hoffman and his wife [name] are going through very difficult time with her son. On his current dose of Trintellix he seems to unemotional to his wife, in the spirits of domestic tranquility Brett Hoffman has requested that we decrease his Trintellix, I am happy to decrease down to 10 mg daily.  We can follow this up in 4 to 6 weeks.

## 2020-09-03 ENCOUNTER — Other Ambulatory Visit: Payer: Self-pay

## 2020-09-03 ENCOUNTER — Ambulatory Visit (INDEPENDENT_AMBULATORY_CARE_PROVIDER_SITE_OTHER): Payer: 59

## 2020-09-03 ENCOUNTER — Encounter: Payer: Self-pay | Admitting: Sports Medicine

## 2020-09-03 ENCOUNTER — Ambulatory Visit (INDEPENDENT_AMBULATORY_CARE_PROVIDER_SITE_OTHER): Payer: 59 | Admitting: Sports Medicine

## 2020-09-03 DIAGNOSIS — J40 Bronchitis, not specified as acute or chronic: Secondary | ICD-10-CM

## 2020-09-03 DIAGNOSIS — J329 Chronic sinusitis, unspecified: Secondary | ICD-10-CM | POA: Diagnosis not present

## 2020-09-03 MED ORDER — BENZONATATE 200 MG PO CAPS: 200.0000 mg | ORAL_CAPSULE | Freq: Three times a day (TID) | ORAL | 0 refills | Status: DC | PRN

## 2020-09-03 MED ORDER — PREDNISONE 50 MG PO TABS: ORAL_TABLET | ORAL | 0 refills | Status: DC

## 2020-09-03 MED ORDER — AZITHROMYCIN 250 MG PO TABS: ORAL_TABLET | ORAL | 0 refills | Status: DC

## 2020-09-03 NOTE — Progress Notes (Signed)
    Procedures performed today:    None.  Independent interpretation of notes and tests performed by another provider:   None.  Brief History, Exam, Impression, and Recommendations:    Sinobronchitis This is a pleasant 40 year old male, for the past 6 days has had increasing facial pain and pressure, runny nose, sore throat, cough, no overt fevers, chills, GI symptoms. On exam he does have tenderness over his maxillary sinuses, oropharynx, ear canals unremarkable, no cervical lymphadenopathy. He does have an isolated right lower lobe expiratory wheeze, for this reason we will go ahead and get a chest x-ray, add 5 days of prednisone, azithromycin, Tessalon Perles for the cough. He will also get a home COVID test. Return to see me if no better in a week or so.    ___________________________________________ Ihor Austin. Benjamin Stain, M.D., ABFM., CAQSM. Primary Care and Sports Medicine [facility]  Adjunct Instructor of Family Medicine  [employer]

## 2020-09-03 NOTE — Assessment & Plan Note (Addendum)
This is a pleasant 40 year old male, for the past 6 days has had increasing facial pain and pressure, runny nose, sore throat, cough, no overt fevers, chills, GI symptoms. On exam he does have tenderness over his maxillary sinuses, oropharynx, ear canals unremarkable, no cervical lymphadenopathy. He does have an isolated right lower lobe expiratory wheeze, for this reason we will go ahead and get a chest x-ray, add 5 days of prednisone, azithromycin, Tessalon Perles for the cough. He will also get a home COVID test. Return to see me if no better in a week or so.

## 2020-09-09 ENCOUNTER — Other Ambulatory Visit: Payer: Self-pay | Admitting: Sports Medicine

## 2020-09-09 DIAGNOSIS — F411 Generalized anxiety disorder: Secondary | ICD-10-CM

## 2020-09-09 MED ORDER — VORTIOXETINE HBR 10 MG PO TABS: 10.0000 mg | ORAL_TABLET | Freq: Every day | ORAL | 3 refills | Status: DC

## 2020-11-28 ENCOUNTER — Other Ambulatory Visit: Payer: Self-pay | Admitting: Sports Medicine

## 2020-11-28 DIAGNOSIS — E782 Mixed hyperlipidemia: Secondary | ICD-10-CM

## 2021-01-03 ENCOUNTER — Other Ambulatory Visit: Payer: Self-pay | Admitting: Sports Medicine

## 2021-01-03 DIAGNOSIS — E782 Mixed hyperlipidemia: Secondary | ICD-10-CM

## 2021-01-07 ENCOUNTER — Ambulatory Visit (INDEPENDENT_AMBULATORY_CARE_PROVIDER_SITE_OTHER): Payer: 59 | Admitting: Sports Medicine

## 2021-01-07 ENCOUNTER — Ambulatory Visit (INDEPENDENT_AMBULATORY_CARE_PROVIDER_SITE_OTHER): Payer: 59

## 2021-01-07 ENCOUNTER — Other Ambulatory Visit: Payer: Self-pay

## 2021-01-07 DIAGNOSIS — N50811 Right testicular pain: Secondary | ICD-10-CM | POA: Diagnosis not present

## 2021-01-07 DIAGNOSIS — N50812 Left testicular pain: Secondary | ICD-10-CM | POA: Diagnosis not present

## 2021-01-07 DIAGNOSIS — N50819 Testicular pain, unspecified: Secondary | ICD-10-CM | POA: Insufficient documentation

## 2021-01-07 MED ORDER — ALL-BODY MASSAGE MISC: 0 refills | Status: AC

## 2021-01-07 NOTE — Addendum Note (Signed)
Addended by: Monica Becton on: 01/07/2021 03:52 PM   Modules accepted: Orders

## 2021-01-07 NOTE — Addendum Note (Signed)
Addended by: Monica Becton on: 01/07/2021 03:33 PM   Modules accepted: Orders

## 2021-01-07 NOTE — Progress Notes (Signed)
    Procedures performed today:    None.  Independent interpretation of notes and tests performed by another provider:   None.  Brief History, Exam, Impression, and Recommendations:    Testicular pain Pleasant 40 year old male, mild bilateral testicular pain, on exam no penile discharge, he does have what appears to be an abrasion on the right side of the glans. No visible discharge. Testicular exam reveals normal testicles, normal epididymis, he does have a bilateral varicocele on palpation. Differential also includes spermatocele. We will get an ultrasound for completion purposes (no suspicion for torsion so no need for Doppler) and do full STD screening. Information given regarding varicocele. No obvious hernias.    ___________________________________________ Ihor Austin. Benjamin Stain, M.D., ABFM., CAQSM. Primary Care and Sports Medicine [place] [facility]  Adjunct Instructor of Family Medicine  [employer]

## 2021-01-07 NOTE — Assessment & Plan Note (Addendum)
Pleasant 40 year old male, mild bilateral testicular pain, on exam no penile discharge, he does have what appears to be an abrasion on the right side of the glans. No visible discharge. Testicular exam reveals normal testicles, normal epididymis, he does have a bilateral varicocele on palpation. Differential also includes spermatocele. We will get an ultrasound for completion purposes (no suspicion for torsion so no need for Doppler) and do full STD screening. Information given regarding varicocele. No obvious hernias.

## 2021-01-08 LAB — PSA, TOTAL AND FREE
PSA, % Free: 33 % (calc) (ref >25)
PSA, Free: 0.1 ng/mL
PSA, Total: 0.3 ng/mL (ref <=4.0)

## 2021-01-11 LAB — URINALYSIS W MICROSCOPIC + REFLEX CULTURE
Bacteria, UA: NONE SEEN /HPF
Bilirubin Urine: NEGATIVE
Glucose, UA: NEGATIVE
Hgb urine dipstick: NEGATIVE
Hyaline Cast: NONE SEEN /LPF
Ketones, ur: NEGATIVE
Leukocyte Esterase: NEGATIVE
Nitrites, Initial: NEGATIVE
Protein, ur: NEGATIVE
RBC / HPF: NONE SEEN /HPF (ref 0–2)
Specific Gravity, Urine: 1.003 (ref 1.001–1.035)
Squamous Epithelial / HPF: NONE SEEN /HPF (ref <=5)
WBC, UA: NONE SEEN /HPF (ref 0–5)
pH: 7 (ref 5.0–8.0)

## 2021-01-11 LAB — HSV 1/2 AB (IGM), IFA W/RFLX TITER
HSV 1 IgM Screen: NEGATIVE
HSV 2 IgM Screen: NEGATIVE

## 2021-01-11 LAB — NO CULTURE INDICATED

## 2021-01-11 LAB — HEPATITIS PANEL, ACUTE
Hep A IgM: NONREACTIVE
Hep B C IgM: NONREACTIVE
Hepatitis B Surface Ag: NONREACTIVE
Hepatitis C Ab: NONREACTIVE
SIGNAL TO CUT-OFF: 0.08 (ref <1.00)

## 2021-01-11 LAB — HIV ANTIBODY (ROUTINE TESTING W REFLEX): HIV 1&2 Ab, 4th Generation: NONREACTIVE

## 2021-01-11 LAB — C. TRACHOMATIS/N. GONORRHOEAE RNA
C. trachomatis RNA, TMA: NOT DETECTED
N. gonorrhoeae RNA, TMA: NOT DETECTED

## 2021-01-11 LAB — RPR: RPR Ser Ql: NONREACTIVE

## 2021-02-03 ENCOUNTER — Telehealth: Payer: Self-pay | Admitting: Sports Medicine

## 2021-02-03 DIAGNOSIS — E291 Testicular hypofunction: Secondary | ICD-10-CM

## 2021-02-03 DIAGNOSIS — E782 Mixed hyperlipidemia: Secondary | ICD-10-CM

## 2021-02-03 NOTE — Telephone Encounter (Signed)
Brett Hoffman is here, he would like some routine labs

## 2021-02-04 ENCOUNTER — Other Ambulatory Visit: Payer: Self-pay | Admitting: Sports Medicine

## 2021-02-04 DIAGNOSIS — E782 Mixed hyperlipidemia: Secondary | ICD-10-CM

## 2021-02-07 LAB — CBC
HCT: 51.5 % — ABNORMAL HIGH (ref 38.5–50.0)
Hemoglobin: 18.1 g/dL — ABNORMAL HIGH (ref 13.2–17.1)
MCH: 34.6 pg — ABNORMAL HIGH (ref 27.0–33.0)
MCHC: 35.1 g/dL (ref 32.0–36.0)
MCV: 98.5 fL (ref 80.0–100.0)
MPV: 10.9 fL (ref 7.5–12.5)
Platelets: 222 10*3/uL (ref 140–400)
RBC: 5.23 10*6/uL (ref 4.20–5.80)
RDW: 12.4 % (ref 11.0–15.0)
WBC: 5.3 10*3/uL (ref 3.8–10.8)

## 2021-02-07 LAB — COMPREHENSIVE METABOLIC PANEL
AG Ratio: 2.2 (calc) (ref 1.0–2.5)
ALT: 21 U/L (ref 9–46)
AST: 18 U/L (ref 10–40)
Albumin: 4.8 g/dL (ref 3.6–5.1)
Alkaline phosphatase (APISO): 60 U/L (ref 36–130)
BUN: 15 mg/dL (ref 7–25)
CO2: 28 mmol/L (ref 20–32)
Calcium: 9.9 mg/dL (ref 8.6–10.3)
Chloride: 98 mmol/L (ref 98–110)
Creat: 0.86 mg/dL (ref 0.60–1.26)
Globulin: 2.2 g/dL (calc) (ref 1.9–3.7)
Glucose, Bld: 100 mg/dL — ABNORMAL HIGH (ref 65–99)
Potassium: 4.4 mmol/L (ref 3.5–5.3)
Sodium: 134 mmol/L — ABNORMAL LOW (ref 135–146)
Total Bilirubin: 0.7 mg/dL (ref 0.2–1.2)
Total Protein: 7 g/dL (ref 6.1–8.1)

## 2021-02-07 LAB — TSH: TSH: 1.9 mIU/L (ref 0.40–4.50)

## 2021-02-07 LAB — LIPID PANEL
Cholesterol: 183 mg/dL (ref <200)
HDL: 58 mg/dL (ref >=40)
LDL Cholesterol (Calc): 108 mg/dL (calc) — ABNORMAL HIGH
Non-HDL Cholesterol (Calc): 125 mg/dL (calc) (ref <130)
Total CHOL/HDL Ratio: 3.2 (calc) (ref <5.0)
Triglycerides: 84 mg/dL (ref <150)

## 2021-02-07 LAB — TESTOSTERONE, FREE & TOTAL
Free Testosterone: 144 pg/mL (ref 35.0–155.0)
Testosterone, Total, LC-MS-MS: 690 ng/dL (ref 250–1100)

## 2021-03-04 ENCOUNTER — Other Ambulatory Visit: Payer: Self-pay | Admitting: Sports Medicine

## 2021-03-04 DIAGNOSIS — E782 Mixed hyperlipidemia: Secondary | ICD-10-CM

## 2021-03-28 ENCOUNTER — Other Ambulatory Visit: Payer: Self-pay | Admitting: Sports Medicine

## 2021-03-28 DIAGNOSIS — J329 Chronic sinusitis, unspecified: Secondary | ICD-10-CM

## 2021-04-01 ENCOUNTER — Other Ambulatory Visit: Payer: Self-pay | Admitting: Sports Medicine

## 2021-04-01 DIAGNOSIS — E782 Mixed hyperlipidemia: Secondary | ICD-10-CM

## 2021-04-11 ENCOUNTER — Other Ambulatory Visit: Payer: Self-pay

## 2021-04-11 ENCOUNTER — Ambulatory Visit (INDEPENDENT_AMBULATORY_CARE_PROVIDER_SITE_OTHER): Payer: 59 | Admitting: Sports Medicine

## 2021-04-11 DIAGNOSIS — N489 Disorder of penis, unspecified: Secondary | ICD-10-CM | POA: Diagnosis not present

## 2021-04-11 MED ORDER — DOXYCYCLINE HYCLATE 100 MG PO TABS: 100.0000 mg | ORAL_TABLET | Freq: Two times a day (BID) | ORAL | 0 refills | Status: AC

## 2021-04-11 NOTE — Addendum Note (Signed)
Addended by: Monica Becton on: 04/11/2021 03:34 PM   Modules accepted: Orders

## 2021-04-11 NOTE — Progress Notes (Signed)
° ° °  Procedures performed today:    None.  Independent interpretation of notes and tests performed by another provider:   None.  Brief History, Exam, Impression, and Recommendations:    Lesion of penis This is a pleasant 41 year old male, recent new relationship, has been having lots of intercourse, unfortunately more recently had a lesion on the dorsum of his shaft, on exam it is a single punctate lesion with surrounding erythema, there is also what appears to be a bit of folliculitis more proximal on the pubic hair. This does not have the grouped vesicular appearance of genital herpes, he has no discharge, no dysuria. I think this is simply a folliculitis likely from abrasion. However I am going to get full STD screens including HSV 2 IgG and IgM. Adding doxycycline. Avoid intercourse until test are back    ___________________________________________ Gwen Her. Dianah Field, M.D., ABFM., CAQSM. Primary Care and [place] Instructor of [employer] [facility] of Medicine

## 2021-04-11 NOTE — Assessment & Plan Note (Signed)
This is a pleasant 41 year old male, recent new relationship, has been having lots of intercourse, unfortunately more recently had a lesion on the dorsum of his shaft, on exam it is a single punctate lesion with surrounding erythema, there is also what appears to be a bit of folliculitis more proximal on the pubic hair. This does not have the grouped vesicular appearance of genital herpes, he has no discharge, no dysuria. I think this is simply a folliculitis likely from abrasion. However I am going to get full STD screens including HSV 2 IgG and IgM. Adding doxycycline. Avoid intercourse until test are back

## 2021-04-16 LAB — C. TRACHOMATIS/N. GONORRHOEAE RNA
C. trachomatis RNA, TMA: NOT DETECTED
N. gonorrhoeae RNA, TMA: NOT DETECTED

## 2021-04-16 LAB — HEPATITIS PANEL, ACUTE
Hep A IgM: NONREACTIVE
Hep B C IgM: NONREACTIVE
Hepatitis B Surface Ag: NONREACTIVE
Hepatitis C Ab: NONREACTIVE
SIGNAL TO CUT-OFF: 0.03 (ref <1.00)

## 2021-04-16 LAB — HSV 2 ANTIBODY, IGG: HSV 2 Glycoprotein G Ab, IgG: <0.9 index

## 2021-04-16 LAB — HIV ANTIBODY (ROUTINE TESTING W REFLEX): HIV 1&2 Ab, 4th Generation: NONREACTIVE

## 2021-04-16 LAB — RPR: RPR Ser Ql: NONREACTIVE

## 2021-04-16 LAB — HSV 1/2 AB (IGM), IFA W/RFLX TITER
HSV 1 IgM Screen: NEGATIVE
HSV 2 IgM Screen: NEGATIVE

## 2021-06-03 ENCOUNTER — Other Ambulatory Visit: Payer: Self-pay | Admitting: Sports Medicine

## 2021-06-03 DIAGNOSIS — F411 Generalized anxiety disorder: Secondary | ICD-10-CM

## 2021-06-10 ENCOUNTER — Other Ambulatory Visit: Payer: Self-pay | Admitting: Sports Medicine

## 2021-06-10 DIAGNOSIS — F411 Generalized anxiety disorder: Secondary | ICD-10-CM

## 2021-06-10 MED ORDER — VORTIOXETINE HBR 10 MG PO TABS: 10.0000 mg | ORAL_TABLET | Freq: Every day | ORAL | 11 refills | Status: DC

## 2021-06-17 ENCOUNTER — Other Ambulatory Visit: Payer: Self-pay | Admitting: Sports Medicine

## 2021-06-17 MED ORDER — VALACYCLOVIR HCL 1 G PO TABS: 1000.0000 mg | ORAL_TABLET | Freq: Two times a day (BID) | ORAL | 3 refills | Status: AC

## 2021-07-10 ENCOUNTER — Telehealth: Payer: Self-pay | Admitting: Sports Medicine

## 2021-07-10 DIAGNOSIS — H109 Unspecified conjunctivitis: Secondary | ICD-10-CM | POA: Insufficient documentation

## 2021-07-10 MED ORDER — AZELASTINE HCL 0.05 % OP SOLN: 2.0000 [drp] | Freq: Two times a day (BID) | OPHTHALMIC | 11 refills | Status: DC

## 2021-07-10 NOTE — Telephone Encounter (Signed)
Conjunctivitis ?Unclear etiology, bilateral, associated runny nose, no viral symptoms. ?Adding Optivar for the potential allergic component, unlikely bacterial so we do not need an antibiotic. ? ?

## 2021-07-10 NOTE — Assessment & Plan Note (Signed)
Unclear etiology, bilateral, associated runny nose, no viral symptoms. ?Adding Optivar for the potential allergic component, unlikely bacterial so we do not need an antibiotic. ?

## 2021-08-05 ENCOUNTER — Other Ambulatory Visit: Payer: Self-pay | Admitting: Sports Medicine

## 2021-08-05 DIAGNOSIS — E782 Mixed hyperlipidemia: Secondary | ICD-10-CM

## 2021-09-03 ENCOUNTER — Other Ambulatory Visit: Payer: Self-pay | Admitting: Sports Medicine

## 2021-09-03 DIAGNOSIS — E782 Mixed hyperlipidemia: Secondary | ICD-10-CM

## 2021-10-01 ENCOUNTER — Ambulatory Visit (INDEPENDENT_AMBULATORY_CARE_PROVIDER_SITE_OTHER): Payer: 59 | Admitting: Sports Medicine

## 2021-10-01 ENCOUNTER — Encounter: Payer: Self-pay | Admitting: Sports Medicine

## 2021-10-01 DIAGNOSIS — E669 Obesity, unspecified: Secondary | ICD-10-CM | POA: Insufficient documentation

## 2021-10-01 MED ORDER — WEGOVY 0.25 MG/0.5ML ~~LOC~~ SOAJ: 0.2500 mg | SUBCUTANEOUS | 0 refills | Status: DC

## 2021-10-01 NOTE — Assessment & Plan Note (Signed)
This is a very pleasant 41 year old male, has struggled to lose weight, we will start [facility], he has tried diet, exercise, he will be doing calorie counting and will be enrolled in a multidisciplinary weight loss program. Calling in [place], if unable to get this approved we will try boot leg semaglutide.

## 2021-10-01 NOTE — Progress Notes (Signed)
    Procedures performed today:    None.  Independent interpretation of notes and tests performed by another provider:   None.  Brief History, Exam, Impression, and Recommendations:    Obesity (BMI 30-39.9) This is a very pleasant 41 year old male, has struggled to lose weight, we will start [facility], he has tried diet, exercise, he will be doing calorie counting and will be enrolled in a multidisciplinary weight loss program. Calling in [place], if unable to get this approved we will try boot leg semaglutide.  I spent 30 minutes of total time managing this patient today, this includes chart review, face to face, and non-face to face time.  ____________________________________________ Ihor Austin. Benjamin Stain, M.D., ABFM., CAQSM., AME. Primary Care and Sports Medicine [place] [facility]  Adjunct Professor of Family Medicine  [employer] of [employer]  Restaurant manager, fast food

## 2021-10-14 ENCOUNTER — Telehealth: Payer: Self-pay

## 2021-10-14 NOTE — Telephone Encounter (Signed)
[facility] done through Covermymeds. Returned with the following approval:  CaseId:[id number];Status:Approved;Review Type:Prior Auth;Coverage Start Date:09/14/2021;Coverage End Date:05/12/2022;

## 2021-11-12 ENCOUNTER — Other Ambulatory Visit: Payer: Self-pay

## 2021-11-12 DIAGNOSIS — E669 Obesity, unspecified: Secondary | ICD-10-CM

## 2021-11-12 MED ORDER — WEGOVY 0.25 MG/0.5ML ~~LOC~~ SOAJ: 0.2500 mg | SUBCUTANEOUS | 0 refills | Status: DC

## 2021-11-12 NOTE — Telephone Encounter (Signed)
Patient states the pharmacy never received the prescription. Will resend it.

## 2021-11-25 ENCOUNTER — Other Ambulatory Visit: Payer: Self-pay

## 2021-11-25 DIAGNOSIS — F411 Generalized anxiety disorder: Secondary | ICD-10-CM

## 2021-11-25 MED ORDER — VORTIOXETINE HBR 10 MG PO TABS: 10.0000 mg | ORAL_TABLET | Freq: Every day | ORAL | 11 refills | Status: DC

## 2021-11-25 NOTE — Telephone Encounter (Signed)
Patient text to say he was completely out of trintellix and needs a refill.

## 2021-12-15 ENCOUNTER — Other Ambulatory Visit: Payer: Self-pay | Admitting: Sports Medicine

## 2021-12-15 DIAGNOSIS — E782 Mixed hyperlipidemia: Secondary | ICD-10-CM

## 2021-12-18 ENCOUNTER — Other Ambulatory Visit: Payer: Self-pay | Admitting: Sports Medicine

## 2021-12-18 DIAGNOSIS — Q5522 Retractile testis: Secondary | ICD-10-CM | POA: Insufficient documentation

## 2021-12-18 DIAGNOSIS — E669 Obesity, unspecified: Secondary | ICD-10-CM

## 2021-12-18 MED ORDER — WEGOVY 2.4 MG/0.75ML ~~LOC~~ SOAJ: 2.4000 mg | SUBCUTANEOUS | 11 refills | Status: DC

## 2021-12-18 MED ORDER — WEGOVY 1.7 MG/0.75ML ~~LOC~~ SOAJ: 1.7000 mg | SUBCUTANEOUS | 0 refills | Status: DC

## 2021-12-18 MED ORDER — SEMAGLUTIDE (2 MG/DOSE) 8 MG/3ML ~~LOC~~ SOPN: PEN_INJECTOR | SUBCUTANEOUS | 3 refills | Status: DC

## 2021-12-18 NOTE — Telephone Encounter (Signed)
Brett Hoffman is calling about several issues, parietal headaches, present for about a week to a week and a half, dull, potentially throbbing but no photophobia, phonophobia. Suspect sinus type headache versus migraine. He will treat this conservatively with over-the-counter NSAIDs and acetaminophen and let me know if things do not improve.  In addition having trouble getting Wegovy at the lower doses, we will send the compounded semaglutide in and he can use that for the first 3 doses/months and then we will go ahead and send in 1.7 and 2.4 mg Wegovy to his pharmacy.  Lastly he would like a referral to urology for retractile testicles.

## 2021-12-18 NOTE — Assessment & Plan Note (Signed)
Patient would like a referral to urology

## 2021-12-31 ENCOUNTER — Encounter (INDEPENDENT_AMBULATORY_CARE_PROVIDER_SITE_OTHER): Payer: 59 | Admitting: Sports Medicine

## 2021-12-31 DIAGNOSIS — F411 Generalized anxiety disorder: Secondary | ICD-10-CM | POA: Diagnosis not present

## 2021-12-31 MED ORDER — VORTIOXETINE HBR 10 MG PO TABS: 10.0000 mg | ORAL_TABLET | Freq: Every day | ORAL | 3 refills | Status: DC

## 2021-12-31 NOTE — Telephone Encounter (Signed)
I spent 5 total minutes of online digital evaluation and management services in this patient-initiated request for online care. 

## 2022-01-28 ENCOUNTER — Ambulatory Visit (INDEPENDENT_AMBULATORY_CARE_PROVIDER_SITE_OTHER): Payer: 59

## 2022-01-28 ENCOUNTER — Ambulatory Visit (INDEPENDENT_AMBULATORY_CARE_PROVIDER_SITE_OTHER): Payer: 59 | Admitting: Sports Medicine

## 2022-01-28 DIAGNOSIS — J3489 Other specified disorders of nose and nasal sinuses: Secondary | ICD-10-CM | POA: Insufficient documentation

## 2022-01-28 DIAGNOSIS — N5089 Other specified disorders of the male genital organs: Secondary | ICD-10-CM

## 2022-01-28 DIAGNOSIS — R1909 Other intra-abdominal and pelvic swelling, mass and lump: Secondary | ICD-10-CM

## 2022-01-28 DIAGNOSIS — J340 Abscess, furuncle and carbuncle of nose: Secondary | ICD-10-CM

## 2022-01-28 DIAGNOSIS — E782 Mixed hyperlipidemia: Secondary | ICD-10-CM

## 2022-01-28 MED ORDER — PREDNISONE 50 MG PO TABS: ORAL_TABLET | ORAL | 0 refills | Status: DC

## 2022-01-28 MED ORDER — MUPIROCIN 2 % EX OINT: TOPICAL_OINTMENT | CUTANEOUS | 3 refills | Status: AC

## 2022-01-28 MED ORDER — DOXYCYCLINE HYCLATE 100 MG PO TABS: 100.0000 mg | ORAL_TABLET | Freq: Two times a day (BID) | ORAL | 0 refills | Status: AC

## 2022-01-28 NOTE — Addendum Note (Signed)
Addended by: Monica Becton on: 01/28/2022 11:25 AM   Modules accepted: Orders

## 2022-01-28 NOTE — Assessment & Plan Note (Signed)
This is a 41 year old male, has noted a subcutaneous slightly tender mass in the right inguinal region between the right hemiscrotum and right upper inner thigh. He has also noted a difference in texture between his left and right testicles, the right testicle does feel significantly more firm. No penile discharge, dysuria, fevers, chills, trauma. On exam he does have what feels to be a subcutaneous cyst right inguinal region, otherwise no scrotal or penile abnormalities, no obvious evidence of a hernia. We will proceed with a scrotal ultrasound with close attention paid to the subcutaneous lesion. Differential includes sebaceous cyst, lymphadenopathy, varicose vein.

## 2022-01-28 NOTE — Progress Notes (Addendum)
Mass inguinal right   Procedures performed today:    None.  Independent interpretation of notes and tests performed by another provider:   None.  Brief History, Exam, Impression, and Recommendations:    Mass of right inguinal region This is a 41 year old male, has noted a subcutaneous slightly tender mass in the right inguinal region between the right hemiscrotum and right upper inner thigh. He has also noted a difference in texture between his left and right testicles, the right testicle does feel significantly more firm. No penile discharge, dysuria, fevers, chills, trauma. On exam he does have what feels to be a subcutaneous cyst right inguinal region, otherwise no scrotal or penile abnormalities, no obvious evidence of a hernia. We will proceed with a scrotal ultrasound with close attention paid to the subcutaneous lesion. Differential includes sebaceous cyst, lymphadenopathy, varicose vein.  Cellulitis of nasal tip Also has had a week or so of pain left nasal nare on the inside. Moderate tenderness, on exam he does have what looks to be a cellulitis visible mostly as a fullness into his nasal cavity in the left side. Unclear etiology, but suspecting cellulitis, there is also significant rhinitis with clear discharge so adding 5 days of prednisone, doxycycline and topical mupirocin to be placed into the naris. Return to see me as needed for this.  Mixed hyperlipidemia Still elevated, increasing atorvastatin to 20 mg daily, recheck in 3 months.    ____________________________________________ Ihor Austin. Benjamin Stain, M.D., ABFM., CAQSM., AME. Primary Care and Sports Medicine [facility] [facility]  Adjunct Professor of Family Medicine  [place] [employer] [facility] [employer] [facility] [employer]  Restaurant manager, fast food

## 2022-01-28 NOTE — Assessment & Plan Note (Signed)
Also has had a week or so of pain left nasal nare on the inside. Moderate tenderness, on exam he does have what looks to be a cellulitis visible mostly as a fullness into his nasal cavity in the left side. Unclear etiology, but suspecting cellulitis, there is also significant rhinitis with clear discharge so adding 5 days of prednisone, doxycycline and topical mupirocin to be placed into the naris. Return to see me as needed for this.

## 2022-02-09 ENCOUNTER — Encounter: Payer: Self-pay | Admitting: Sports Medicine

## 2022-02-09 DIAGNOSIS — E538 Deficiency of other specified B group vitamins: Secondary | ICD-10-CM

## 2022-02-09 DIAGNOSIS — E559 Vitamin D deficiency, unspecified: Secondary | ICD-10-CM

## 2022-02-09 DIAGNOSIS — E291 Testicular hypofunction: Secondary | ICD-10-CM

## 2022-02-09 DIAGNOSIS — E782 Mixed hyperlipidemia: Secondary | ICD-10-CM

## 2022-02-19 MED ORDER — ATORVASTATIN CALCIUM 20 MG PO TABS: 20.0000 mg | ORAL_TABLET | Freq: Every day | ORAL | 3 refills | Status: DC

## 2022-02-19 NOTE — Assessment & Plan Note (Signed)
Still elevated, increasing atorvastatin to 20 mg daily, recheck in 3 months.

## 2022-02-19 NOTE — Addendum Note (Signed)
Addended by: Monica Becton on: 02/19/2022 12:37 PM   Modules accepted: Orders

## 2022-02-20 ENCOUNTER — Encounter (INDEPENDENT_AMBULATORY_CARE_PROVIDER_SITE_OTHER): Payer: 59 | Admitting: Sports Medicine

## 2022-02-20 DIAGNOSIS — H109 Unspecified conjunctivitis: Secondary | ICD-10-CM

## 2022-02-20 MED ORDER — ERYTHROMYCIN 5 MG/GM OP OINT: 1.0000 | TOPICAL_OINTMENT | Freq: Three times a day (TID) | OPHTHALMIC | 0 refills | Status: AC

## 2022-02-20 NOTE — Telephone Encounter (Signed)
I spent 5 total minutes of online digital evaluation and management services in this patient-initiated request for online care. 

## 2022-02-23 LAB — TESTOSTERONE, FREE & TOTAL
Free Testosterone: 68.9 pg/mL (ref 35.0–155.0)
Testosterone, Total, LC-MS-MS: 475 ng/dL (ref 250–1100)

## 2022-02-23 LAB — COMPLETE METABOLIC PANEL WITH GFR
AG Ratio: 1.9 (calc) (ref 1.0–2.5)
ALT: 39 U/L (ref 9–46)
AST: 25 U/L (ref 10–40)
Albumin: 4.5 g/dL (ref 3.6–5.1)
Alkaline phosphatase (APISO): 61 U/L (ref 36–130)
BUN: 10 mg/dL (ref 7–25)
CO2: 25 mmol/L (ref 20–32)
Calcium: 9.7 mg/dL (ref 8.6–10.3)
Chloride: 101 mmol/L (ref 98–110)
Creat: 0.81 mg/dL (ref 0.60–1.26)
Globulin: 2.4 g/dL (calc) (ref 1.9–3.7)
Glucose, Bld: 103 mg/dL — ABNORMAL HIGH (ref 65–99)
Potassium: 4.2 mmol/L (ref 3.5–5.3)
Sodium: 138 mmol/L (ref 135–146)
Total Bilirubin: 0.8 mg/dL (ref 0.2–1.2)
Total Protein: 6.9 g/dL (ref 6.1–8.1)
eGFR: 115 mL/min/{1.73_m2} (ref >=60)

## 2022-02-23 LAB — LIPID PANEL
Cholesterol: 219 mg/dL — ABNORMAL HIGH (ref <200)
HDL: 63 mg/dL (ref >=40)
LDL Cholesterol (Calc): 139 mg/dL (calc) — ABNORMAL HIGH
Non-HDL Cholesterol (Calc): 156 mg/dL (calc) — ABNORMAL HIGH (ref <130)
Total CHOL/HDL Ratio: 3.5 (calc) (ref <5.0)
Triglycerides: 71 mg/dL (ref <150)

## 2022-02-23 LAB — VITAMIN D 25 HYDROXY (VIT D DEFICIENCY, FRACTURES): Vit D, 25-Hydroxy: 31 ng/mL (ref 30–100)

## 2022-02-23 LAB — CBC
HCT: 48.1 % (ref 38.5–50.0)
Hemoglobin: 16.9 g/dL (ref 13.2–17.1)
MCH: 33.6 pg — ABNORMAL HIGH (ref 27.0–33.0)
MCHC: 35.1 g/dL (ref 32.0–36.0)
MCV: 95.6 fL (ref 80.0–100.0)
MPV: 11.1 fL (ref 7.5–12.5)
Platelets: 191 10*3/uL (ref 140–400)
RBC: 5.03 10*6/uL (ref 4.20–5.80)
RDW: 12.7 % (ref 11.0–15.0)
WBC: 6.2 10*3/uL (ref 3.8–10.8)

## 2022-02-23 LAB — HEMOGLOBIN A1C
Hgb A1c MFr Bld: 5 % of total Hgb (ref <5.7)
Mean Plasma Glucose: 97 mg/dL
eAG (mmol/L): 5.4 mmol/L

## 2022-02-23 LAB — LIPASE: Lipase: 16 U/L (ref 7–60)

## 2022-02-23 LAB — VITAMIN B12: Vitamin B-12: 300 pg/mL (ref 200–1100)

## 2022-02-23 LAB — TSH: TSH: 2.03 mIU/L (ref 0.40–4.50)

## 2022-03-18 ENCOUNTER — Other Ambulatory Visit: Payer: Self-pay | Admitting: Sports Medicine

## 2022-03-18 DIAGNOSIS — E782 Mixed hyperlipidemia: Secondary | ICD-10-CM

## 2022-04-16 ENCOUNTER — Other Ambulatory Visit: Payer: Self-pay | Admitting: Sports Medicine

## 2022-04-16 DIAGNOSIS — F411 Generalized anxiety disorder: Secondary | ICD-10-CM

## 2022-04-16 MED ORDER — VORTIOXETINE HBR 5 MG PO TABS: 5.0000 mg | ORAL_TABLET | Freq: Every day | ORAL | 3 refills | Status: DC

## 2022-04-16 NOTE — Assessment & Plan Note (Signed)
Brett Hoffman is doing well, he does desire to drop Trintellix from 10 mg down to 5 mg. Things have for the most part stabilized with his home situation.

## 2022-07-20 ENCOUNTER — Ambulatory Visit (INDEPENDENT_AMBULATORY_CARE_PROVIDER_SITE_OTHER): Payer: 59 | Admitting: Sports Medicine

## 2022-07-20 DIAGNOSIS — N50811 Right testicular pain: Secondary | ICD-10-CM

## 2022-07-20 DIAGNOSIS — M7712 Lateral epicondylitis, left elbow: Secondary | ICD-10-CM | POA: Diagnosis not present

## 2022-07-20 DIAGNOSIS — N50812 Left testicular pain: Secondary | ICD-10-CM

## 2022-07-20 DIAGNOSIS — J3489 Other specified disorders of nose and nasal sinuses: Secondary | ICD-10-CM | POA: Diagnosis not present

## 2022-07-20 LAB — POCT URINALYSIS DIP (CLINITEK)
Bilirubin, UA: NEGATIVE
Blood, UA: NEGATIVE
Glucose, UA: NEGATIVE mg/dL
Ketones, POC UA: NEGATIVE mg/dL
Leukocytes, UA: NEGATIVE
Nitrite, UA: NEGATIVE
POC PROTEIN,UA: NEGATIVE
Spec Grav, UA: 1.01 (ref 1.010–1.025)
Urobilinogen, UA: 0.2 E.U./dL
pH, UA: 7 (ref 5.0–8.0)

## 2022-07-20 MED ORDER — MELOXICAM 15 MG PO TABS: ORAL_TABLET | ORAL | 3 refills | Status: DC

## 2022-07-20 MED ORDER — NITROFURANTOIN MONOHYD MACRO 100 MG PO CAPS: 100.0000 mg | ORAL_CAPSULE | Freq: Two times a day (BID) | ORAL | 0 refills | Status: DC

## 2022-07-20 NOTE — Assessment & Plan Note (Signed)
Increasing pain left elbow lateral epicondyle, history of right epicondylitis that necessitated PRP. We will treat him conservatively, he will get a tennis elbow brace, adding meloxicam, home physical therapy, he will do this for 6 to 8 weeks before considering steroid injection.

## 2022-07-20 NOTE — Addendum Note (Signed)
Addended by: Carren Rang A on: 07/20/2022 09:55 AM   Modules accepted: Orders

## 2022-07-20 NOTE — Progress Notes (Signed)
    Procedures performed today:    Procedure:  Cryodestruction of #2 lesions on the nose Consent obtained and verified. Time-out conducted. Noted no overlying erythema, induration, or other signs of local infection. Completed without difficulty using Cryo-Gun. Advised to call if fevers/chills, erythema, induration, drainage, or persistent bleeding.  Independent interpretation of notes and tests performed by another provider:   None.  Brief History, Exam, Impression, and Recommendations:    Lesion of nose Of nose, 1 on the tip, 1 approximately over the right nare. Question basal cell. Aggressive cryotherapy today, return to see me in a month for recheck.  Lateral epicondylitis, left elbow Increasing pain left elbow lateral epicondyle, history of right epicondylitis that necessitated PRP. We will treat him conservatively, he will get a tennis elbow brace, adding meloxicam, home physical therapy, he will do this for 6 to 8 weeks before considering steroid injection.  Testicular pain [name] is getting intermittent testicular pain, this occurred a couple times over the past month while voiding. He also gets increasing dribbling. Suspect either a prostatitis or early BPH. I would like him to do a UA with urine culture, also adding Macrobid. Avoiding Cipro as he does like to lift weights. Does have history of varicocele on ultrasound. If insufficient improvement we will consider lab work, prostate ultrasound, and potentially adding tadalafil or Flomax.    ____________________________________________ Ihor Austin. Benjamin Stain, M.D., ABFM., CAQSM., AME. Primary Care and Sports Medicine [place] [facility]  Adjunct Professor of Family Medicine  [place] [employer] [facility] [employer]  Restaurant manager, fast food

## 2022-07-20 NOTE — Assessment & Plan Note (Signed)
Of nose, 1 on the tip, 1 approximately over the right nare. Question basal cell. Aggressive cryotherapy today, return to see me in a month for recheck.

## 2022-07-20 NOTE — Assessment & Plan Note (Signed)
Brett Hoffman is getting intermittent testicular pain, this occurred a couple times over the past month while voiding. He also gets increasing dribbling. Suspect either a prostatitis or early BPH. I would like him to do a UA with urine culture, also adding Macrobid. Avoiding Cipro as he does like to lift weights. Does have history of varicocele on ultrasound. If insufficient improvement we will consider lab work, prostate ultrasound, and potentially adding tadalafil or Flomax.

## 2022-07-21 LAB — URINE CULTURE
MICRO NUMBER:: 14886844
Result:: NO GROWTH
SPECIMEN QUALITY:: ADEQUATE

## 2022-08-18 ENCOUNTER — Ambulatory Visit (INDEPENDENT_AMBULATORY_CARE_PROVIDER_SITE_OTHER): Payer: 59 | Admitting: Sports Medicine

## 2022-08-18 ENCOUNTER — Other Ambulatory Visit (INDEPENDENT_AMBULATORY_CARE_PROVIDER_SITE_OTHER): Payer: 59

## 2022-08-18 DIAGNOSIS — M7712 Lateral epicondylitis, left elbow: Secondary | ICD-10-CM

## 2022-08-18 DIAGNOSIS — J3489 Other specified disorders of nose and nasal sinuses: Secondary | ICD-10-CM | POA: Diagnosis not present

## 2022-08-18 MED ORDER — HYDROCODONE-ACETAMINOPHEN 10-325 MG PO TABS: 1.0000 | ORAL_TABLET | Freq: Four times a day (QID) | ORAL | 0 refills | Status: DC | PRN

## 2022-08-18 NOTE — Assessment & Plan Note (Signed)
Repeat cryotherapy today, question basal cell carcinoma.

## 2022-08-18 NOTE — Assessment & Plan Note (Signed)
History of right epicondylitis in the distant past that necessitated PRP which seem to be curative, now having increasing pain left common extensor tendon origin, unfortunately he did fail home physical therapy, meloxicam for approximately 6 to 8 weeks, proceeding with PRP percutaneous tenotomy, hydrocodone for postoperative pain, return to see me in approximately 2 weeks.

## 2022-08-18 NOTE — Progress Notes (Signed)
    Procedures performed today:    Procedure: Real-time Ultrasound Guided Platelet Rich Plasma (PRP) Injection of left common extensor tendon origin Device: Samsung HS60  Verbal informed consent obtained.  Time-out conducted.  Noted no overlying erythema, induration, or other signs of local infection.  Obtained 30 cc of blood from peripheral vein, using the "PEAK" centrifuge, red blood cells were separated from the plasma. Subsequently red blood cells were drained leaving only plasma with the buffy coat layer between the desired lines. Platelet poor plasma was then centrifuged out, and remaining platelet rich plasma aspirated into a 5 cc syringe.  Skin prepped in a sterile fashion.  Local anesthesia: Topical Ethyl chloride.  With sterile technique and under real time ultrasound guidance the platelet rich plasma (PRP) obtained above: Using a 22-gauge needle I injected 2 cc lidocaine, 2 cc bupivacaine superficial to and deep to the common extensor tendon origin, I switched to the PRP syringe and I made approximately 50 passes through the common extensor tendon origin injecting platelet rich plasma into and around the tendon origin in the process. Completed without difficulty  Advised to call if fevers/chills, erythema, induration, drainage, or persistent bleeding.  Images permanently stored and available for review in PACS.  Impression: Technically successful ultrasound guided Platelet Rich Plasma (PRP) injection.  Procedure:  Cryodestruction of #2 lesions on the nose Consent obtained and verified. Time-out conducted. Noted no overlying erythema, induration, or other signs of local infection. Completed without difficulty using Cryo-Gun. Advised to call if fevers/chills, erythema, induration, drainage, or persistent bleeding.  Independent interpretation of notes and tests performed by another provider:   None.  Brief History, Exam, Impression, and Recommendations:    Lateral  epicondylitis, left elbow History of right epicondylitis in the distant past that necessitated PRP which seem to be curative, now having increasing pain left common extensor tendon origin, unfortunately he did fail home physical therapy, meloxicam for approximately 6 to 8 weeks, proceeding with PRP percutaneous tenotomy, hydrocodone for postoperative pain, return to see me in approximately 2 weeks.  Lesion of nose Repeat cryotherapy today, question basal cell carcinoma.    ____________________________________________ Ihor Austin. Benjamin Stain, M.D., ABFM., CAQSM., AME. Primary Care and Sports Medicine [place] [facility]  Adjunct Professor of Family Medicine  [place] [employer] [facility] [employer]  Restaurant manager, fast food

## 2022-08-31 ENCOUNTER — Ambulatory Visit (INDEPENDENT_AMBULATORY_CARE_PROVIDER_SITE_OTHER): Payer: 59 | Admitting: Sports Medicine

## 2022-08-31 DIAGNOSIS — M7712 Lateral epicondylitis, left elbow: Secondary | ICD-10-CM | POA: Diagnosis not present

## 2022-08-31 DIAGNOSIS — J3489 Other specified disorders of nose and nasal sinuses: Secondary | ICD-10-CM | POA: Diagnosis not present

## 2022-08-31 MED ORDER — SCOPOLAMINE 1 MG/3DAYS TD PT72: 1.0000 | MEDICATED_PATCH | TRANSDERMAL | 11 refills | Status: DC

## 2022-08-31 MED ORDER — MELOXICAM 15 MG PO TABS: ORAL_TABLET | ORAL | 3 refills | Status: DC

## 2022-08-31 NOTE — Assessment & Plan Note (Signed)
Doing well status post cryotherapy x 2.

## 2022-08-31 NOTE — Progress Notes (Signed)
    Procedures performed today:    None.  Independent interpretation of notes and tests performed by another provider:   None.  Brief History, Exam, Impression, and Recommendations:    Lateral epicondylitis, left elbow History of right epicondylitis lateral that necessitated PRP, which was seemingly curative, we did PRP coverage.  We did a percutaneous tenotomy on the left side 2 weeks ago, he is doing better but still has some pain. Needs to increase the amount of eccentric conditioning, adding meloxicam. Return to see me as needed for this.  Lesion of nose Doing well status post cryotherapy x 2.    ____________________________________________ Ihor Austin. Benjamin Stain, M.D., ABFM., CAQSM., AME. Primary Care and Sports Medicine [place] [facility]  Adjunct Professor of Family Medicine  [place] [employer] [facility] [employer]  Restaurant manager, fast food

## 2022-08-31 NOTE — Assessment & Plan Note (Signed)
History of right epicondylitis lateral that necessitated PRP, which was seemingly curative, we did PRP coverage.  We did a percutaneous tenotomy on the left side 2 weeks ago, he is doing better but still has some pain. Needs to increase the amount of eccentric conditioning, adding meloxicam. Return to see me as needed for this.

## 2022-09-15 ENCOUNTER — Encounter: Payer: Self-pay | Admitting: Sports Medicine

## 2022-09-15 DIAGNOSIS — F411 Generalized anxiety disorder: Secondary | ICD-10-CM

## 2022-09-15 DIAGNOSIS — E669 Obesity, unspecified: Secondary | ICD-10-CM

## 2022-09-15 MED ORDER — BUPROPION HCL ER (XL) 150 MG PO TB24
150.0000 mg | ORAL_TABLET | ORAL | 3 refills | Status: DC
Start: 2022-09-15 — End: 2022-10-15

## 2022-09-15 NOTE — Assessment & Plan Note (Signed)
Would like to taper off of Trintellix, going down to 5 mg daily for a week then stop, he would like to try Wellbutrin, starting 150 XL with a 6-week follow-up.

## 2022-10-15 MED ORDER — BUPROPION HCL ER (XL) 300 MG PO TB24: 300.0000 mg | ORAL_TABLET | ORAL | 3 refills | Status: DC

## 2022-10-15 NOTE — Addendum Note (Signed)
Addended by: Monica Becton on: 10/15/2022 10:27 AM   Modules accepted: Orders

## 2022-11-02 MED ORDER — WEGOVY 0.25 MG/0.5ML ~~LOC~~ SOAJ
0.2500 mg | SUBCUTANEOUS | 0 refills | Status: AC
Start: 2022-11-02 — End: ?

## 2022-11-02 NOTE — Addendum Note (Signed)
Addended by: Monica Becton on: 11/02/2022 12:59 PM   Modules accepted: Orders

## 2022-11-13 ENCOUNTER — Telehealth: Payer: Self-pay | Admitting: Sports Medicine

## 2022-11-13 NOTE — Telephone Encounter (Signed)
Pt called. PA needed for [facility].

## 2022-11-18 ENCOUNTER — Telehealth: Payer: Self-pay | Admitting: Sports Medicine

## 2022-11-18 NOTE — Telephone Encounter (Signed)
Patient called in stating that he needed a PA for [facility]. Please advise.

## 2022-11-19 ENCOUNTER — Encounter: Payer: Self-pay | Admitting: Sports Medicine

## 2022-11-19 DIAGNOSIS — R5383 Other fatigue: Secondary | ICD-10-CM

## 2022-11-26 ENCOUNTER — Telehealth: Payer: Self-pay

## 2022-11-26 NOTE — Telephone Encounter (Signed)
Initiated Prior authorization [id number] 0.25MG /0.5ML auto-injectors Via: Covermymeds Case/Key:[id number] Status: approved as of 11/26/22 Reason:Coverage End Date:06/24/2023; Notified Pt via: Mychart

## 2022-11-27 ENCOUNTER — Other Ambulatory Visit: Payer: Self-pay | Admitting: Sports Medicine

## 2022-11-27 DIAGNOSIS — G4719 Other hypersomnia: Secondary | ICD-10-CM

## 2022-11-28 LAB — COMPREHENSIVE METABOLIC PANEL
ALT: 28 IU/L (ref 0–44)
AST: 21 IU/L (ref 0–40)
Albumin: 4.8 g/dL (ref 4.1–5.1)
Alkaline Phosphatase: 73 IU/L (ref 44–121)
BUN/Creatinine Ratio: 13 (ref 9–20)
BUN: 11 mg/dL (ref 6–24)
Bilirubin Total: 0.8 mg/dL (ref 0.0–1.2)
CO2: 22 mmol/L (ref 20–29)
Calcium: 9.6 mg/dL (ref 8.7–10.2)
Chloride: 101 mmol/L (ref 96–106)
Creatinine, Ser: 0.88 mg/dL (ref 0.76–1.27)
Globulin, Total: 2.2 g/dL (ref 1.5–4.5)
Glucose: 87 mg/dL (ref 70–99)
Potassium: 4 mmol/L (ref 3.5–5.2)
Sodium: 138 mmol/L (ref 134–144)
Total Protein: 7 g/dL (ref 6.0–8.5)
eGFR: 111 mL/min/{1.73_m2} (ref >59)

## 2022-11-28 LAB — CBC
Hematocrit: 50.2 % (ref 37.5–51.0)
Hemoglobin: 17.6 g/dL (ref 13.0–17.7)
MCH: 33.4 pg — ABNORMAL HIGH (ref 26.6–33.0)
MCHC: 35.1 g/dL (ref 31.5–35.7)
MCV: 95 fL (ref 79–97)
Platelets: 196 10*3/uL (ref 150–450)
RBC: 5.27 x10E6/uL (ref 4.14–5.80)
RDW: 12.2 % (ref 11.6–15.4)
WBC: 5.8 10*3/uL (ref 3.4–10.8)

## 2022-11-28 LAB — TESTOSTERONE, FREE, TOTAL, SHBG
Sex Hormone Binding: 29.9 nmol/L (ref 16.5–55.9)
Testosterone, Free: 7.6 pg/mL (ref 6.8–21.5)
Testosterone: 312 ng/dL (ref 264–916)

## 2022-11-28 LAB — VITAMIN D 25 HYDROXY (VIT D DEFICIENCY, FRACTURES): Vit D, 25-Hydroxy: 51.1 ng/mL (ref 30.0–100.0)

## 2022-11-28 LAB — TSH: TSH: 1.95 u[IU]/mL (ref 0.450–4.500)

## 2022-11-28 LAB — VITAMIN B12: Vitamin B-12: 355 pg/mL (ref 232–1245)

## 2022-12-01 ENCOUNTER — Encounter (INDEPENDENT_AMBULATORY_CARE_PROVIDER_SITE_OTHER): Payer: 59 | Admitting: Sports Medicine

## 2022-12-01 DIAGNOSIS — Z0184 Encounter for antibody response examination: Secondary | ICD-10-CM

## 2022-12-01 NOTE — Telephone Encounter (Signed)

## 2022-12-17 LAB — MEASLES/MUMPS/RUBELLA IMMUNITY: Rubella Antibodies, IGG: 6.24 index (ref >0.99)

## 2022-12-20 LAB — QUANTIFERON-TB GOLD PLUS
QuantiFERON Mitogen Value: >10 [IU]/mL
QuantiFERON Nil Value: 0.01 [IU]/mL
QuantiFERON TB1 Ag Value: 0.01 [IU]/mL
QuantiFERON TB2 Ag Value: 0.01 [IU]/mL
QuantiFERON-TB Gold Plus: NEGATIVE

## 2022-12-20 LAB — VARICELLA ZOSTER ANTIBODY, IGG: Varicella zoster IgG: REACTIVE

## 2022-12-20 LAB — MEASLES/MUMPS/RUBELLA IMMUNITY
MUMPS ABS, IGG: 73.4 [AU]/ml (ref >10.9)
RUBEOLA AB, IGG: 82.7 [AU]/ml (ref >16.4)

## 2022-12-22 ENCOUNTER — Other Ambulatory Visit: Payer: Self-pay | Admitting: Sports Medicine

## 2022-12-22 DIAGNOSIS — E669 Obesity, unspecified: Secondary | ICD-10-CM

## 2022-12-22 MED ORDER — WEGOVY 2.4 MG/0.75ML ~~LOC~~ SOAJ
2.4000 mg | SUBCUTANEOUS | 11 refills | Status: DC
Start: 2022-12-22 — End: 2023-04-22

## 2022-12-22 MED ORDER — WEGOVY 1.7 MG/0.75ML ~~LOC~~ SOAJ
1.7000 mg | SUBCUTANEOUS | 0 refills | Status: DC
Start: 2022-12-22 — End: 2023-04-22

## 2022-12-22 MED ORDER — WEGOVY 1 MG/0.5ML ~~LOC~~ SOAJ
1.0000 mg | SUBCUTANEOUS | 0 refills | Status: DC
Start: 2022-12-22 — End: 2023-04-22

## 2022-12-22 MED ORDER — WEGOVY 0.5 MG/0.5ML ~~LOC~~ SOAJ
0.5000 mg | SUBCUTANEOUS | 0 refills | Status: DC
Start: 2022-12-22 — End: 2023-04-22

## 2022-12-23 ENCOUNTER — Encounter: Payer: Self-pay | Admitting: Sports Medicine

## 2022-12-23 DIAGNOSIS — Z0283 Encounter for blood-alcohol and blood-drug test: Secondary | ICD-10-CM

## 2022-12-25 ENCOUNTER — Ambulatory Visit: Payer: 59

## 2023-01-22 LAB — DRUG SCREEN 12+ALCOHOL+CRT, UR
Amphetamines, Urine: NEGATIVE ng/mL
BENZODIAZ UR QL: NEGATIVE ng/mL
Barbiturate: NEGATIVE ng/mL
Cannabinoids: NEGATIVE ng/mL
Cocaine (Metabolite): NEGATIVE ng/mL
Creatinine, Urine: 88.4 mg/dL (ref 20.0–300.0)
Ethanol, Urine: NEGATIVE %
Meperidine: NEGATIVE ng/mL
Methadone: NEGATIVE ng/mL
OPIATE SCREEN URINE: NEGATIVE ng/mL
Oxycodone/Oxymorphone, Urine: NEGATIVE ng/mL
Phencyclidine: NEGATIVE ng/mL
Propoxyphene: NEGATIVE ng/mL
Tramadol: NEGATIVE ng/mL

## 2023-01-29 ENCOUNTER — Ambulatory Visit: Payer: 59

## 2023-02-07 ENCOUNTER — Other Ambulatory Visit: Payer: Self-pay | Admitting: Sports Medicine

## 2023-02-07 DIAGNOSIS — E782 Mixed hyperlipidemia: Secondary | ICD-10-CM

## 2023-02-21 ENCOUNTER — Encounter: Payer: Self-pay | Admitting: Sports Medicine

## 2023-03-01 NOTE — Telephone Encounter (Signed)
Patient would like annual physical exam scheduled sometime January 2025.

## 2023-03-01 NOTE — Telephone Encounter (Signed)
Left message for patient to call back and schedule a physical

## 2023-03-12 IMAGING — DX DG CHEST 2V
2 series · 2 of 2 positions shown · non-contrast
Comparison: 11/02/2019

CLINICAL DATA: Right basilar wheezing, cough, rhinorrhea

EXAM:
CHEST - 2 VIEW

[chest pa]
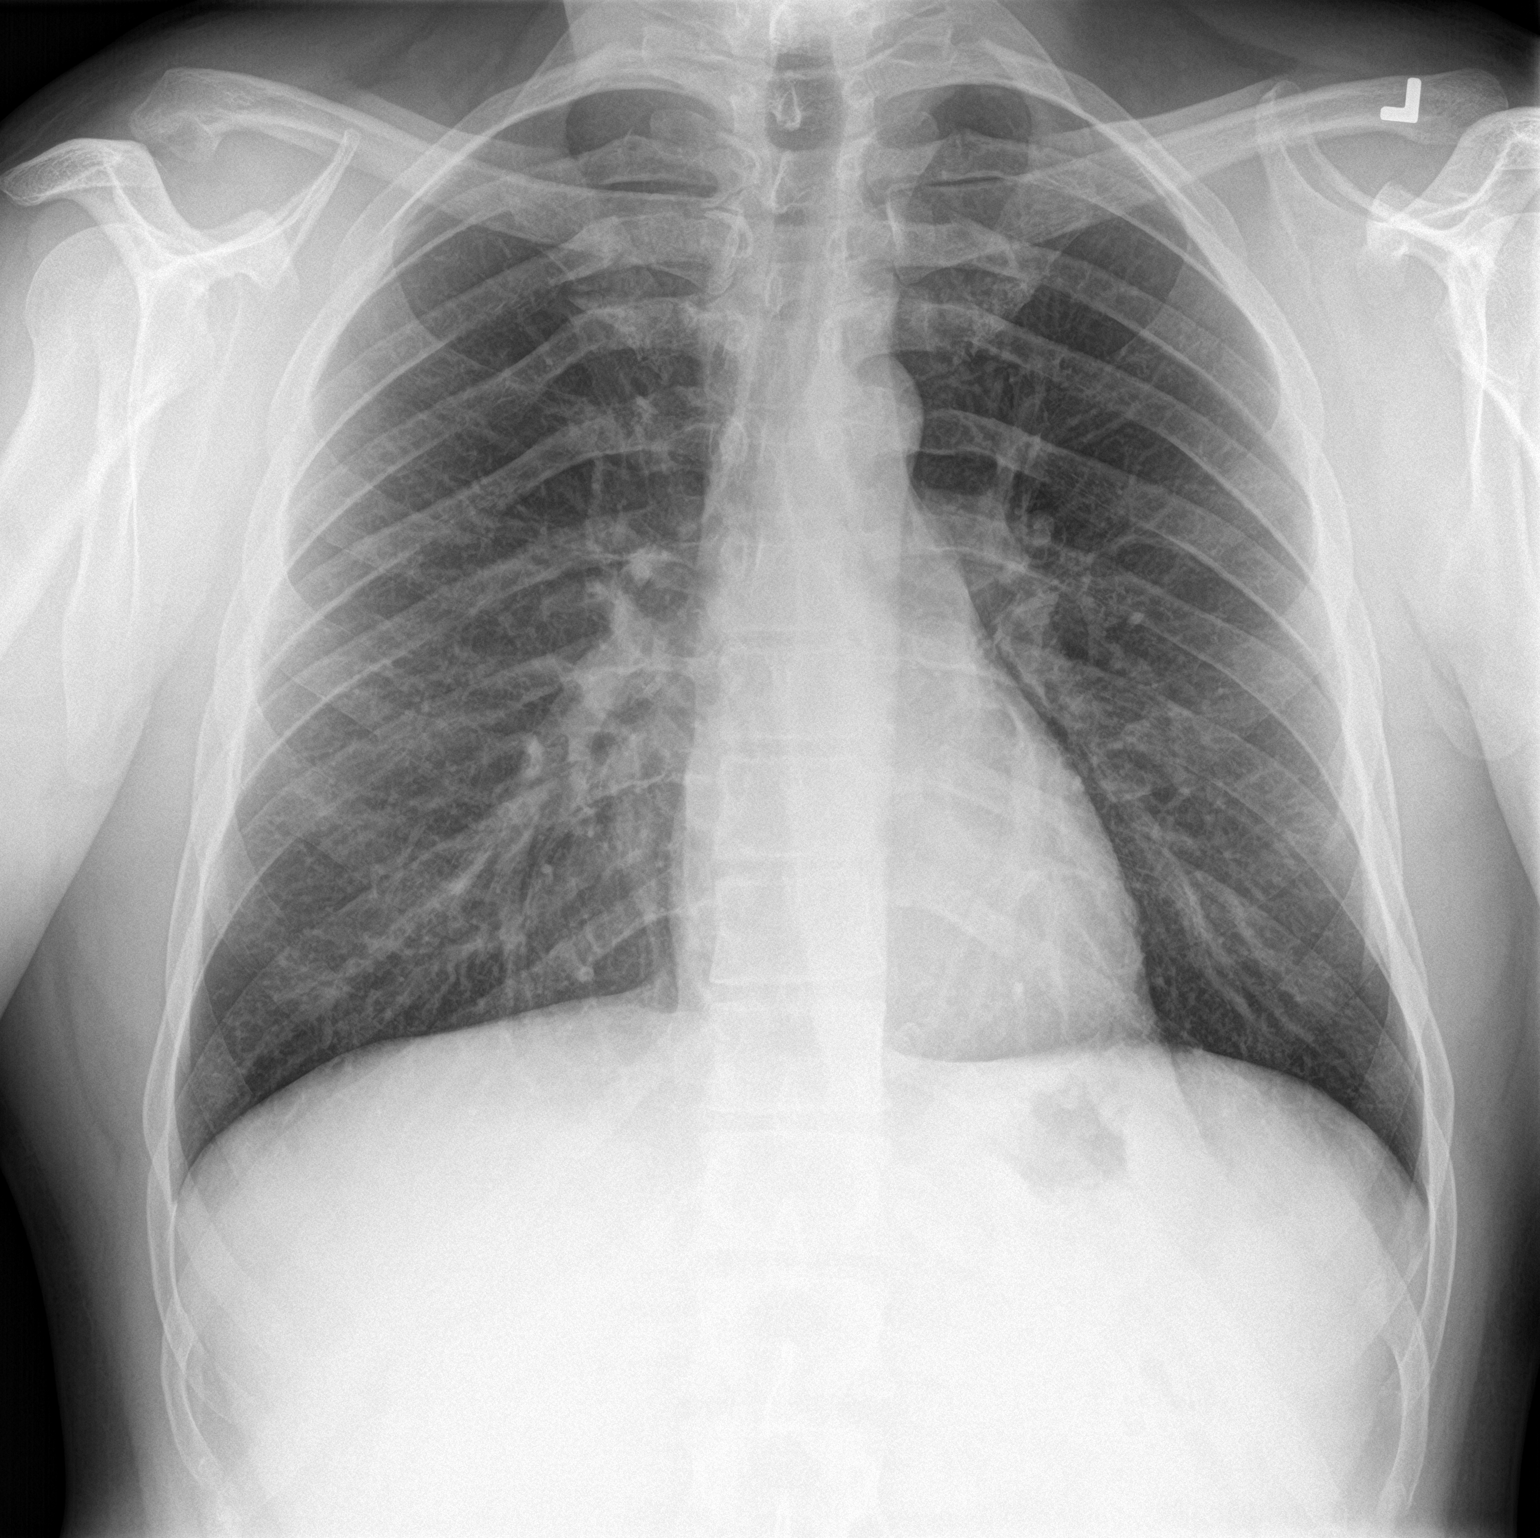

[chest lat]
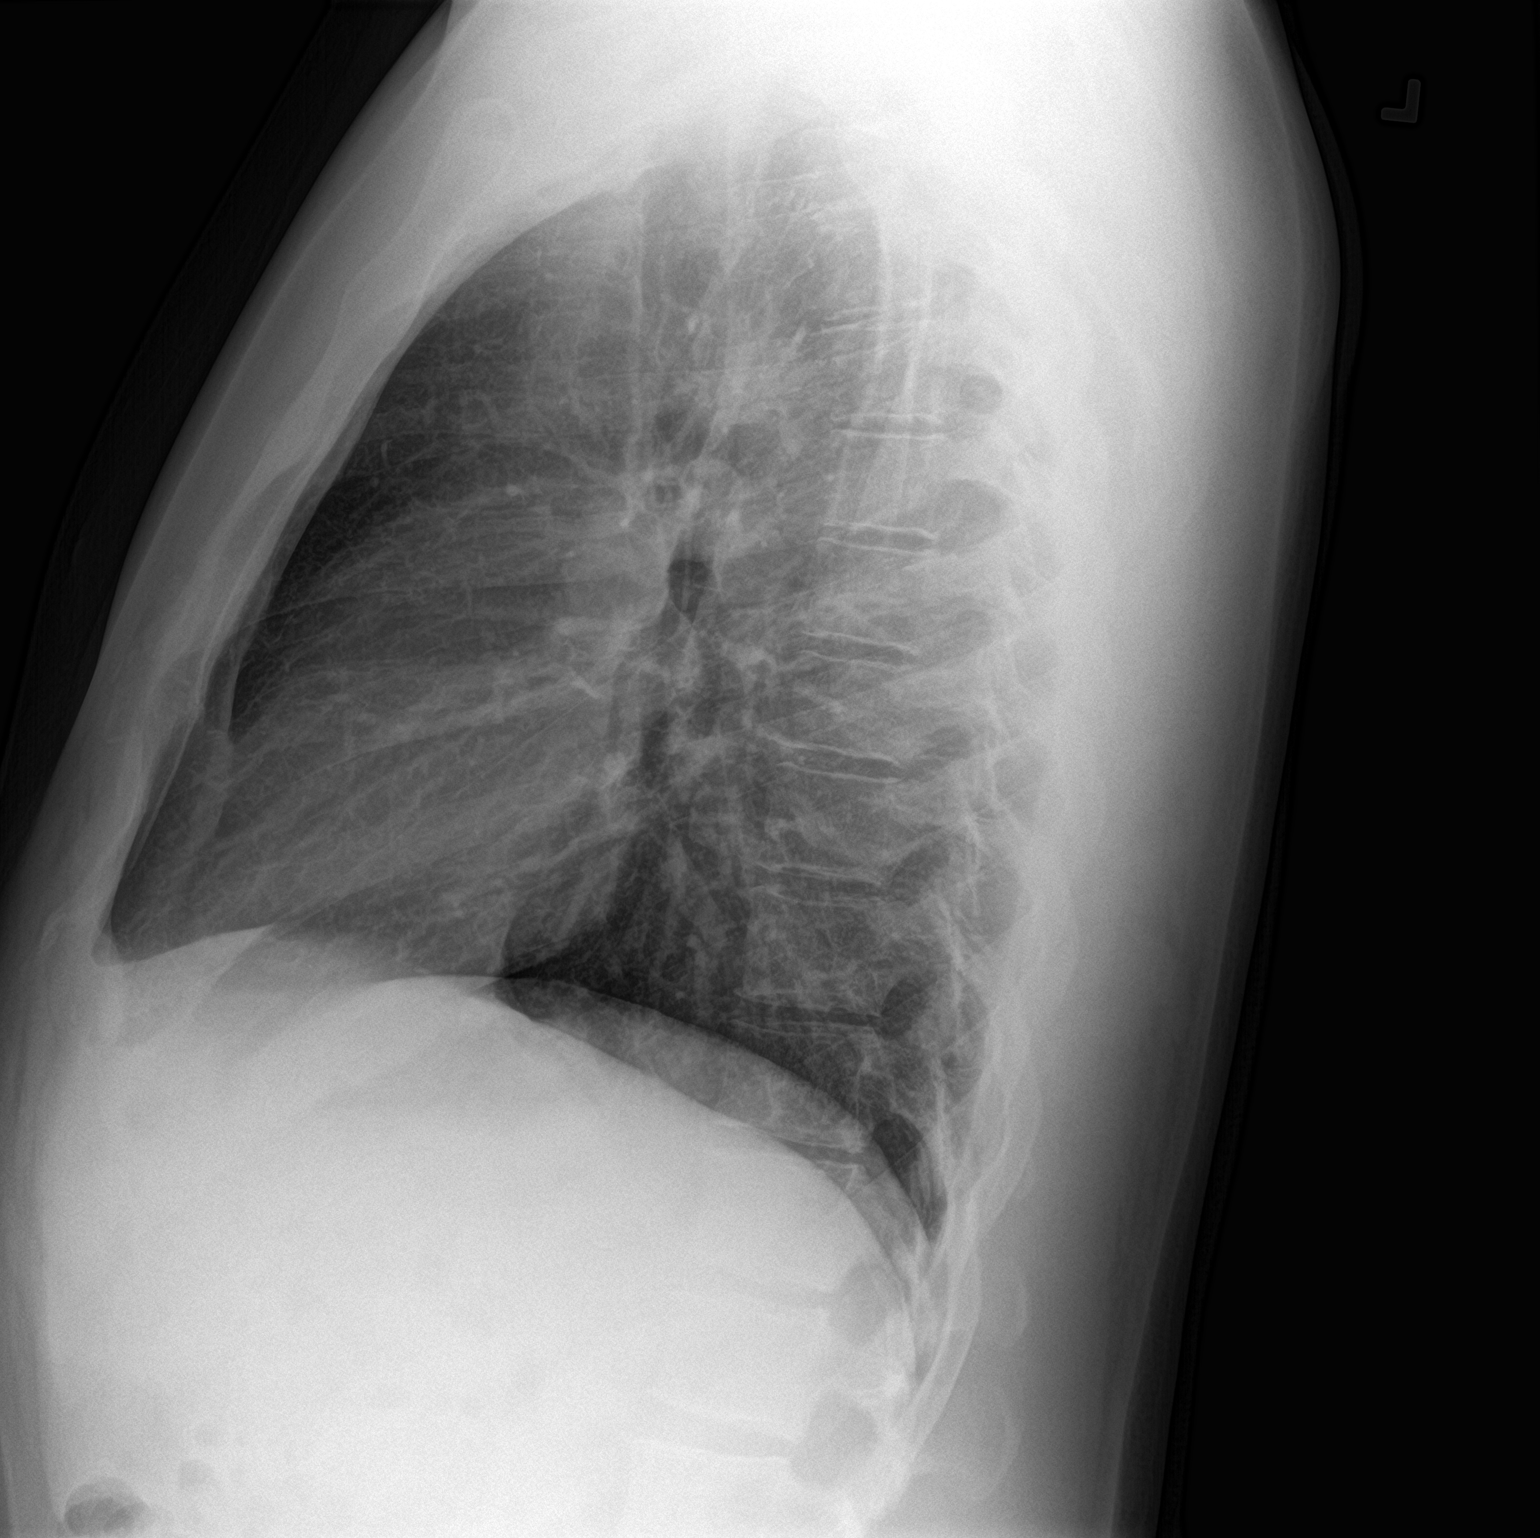

[2 of 2 positions shown; findings below may reference images not displayed]

FINDINGS: Frontal and lateral views of the chest demonstrate a stable cardiac
silhouette. No acute airspace disease, effusion, or pneumothorax. No
acute bony abnormalities.
IMPRESSION: 1. No acute intrathoracic process.

## 2023-03-21 ENCOUNTER — Encounter (INDEPENDENT_AMBULATORY_CARE_PROVIDER_SITE_OTHER): Payer: 59 | Admitting: Sports Medicine

## 2023-03-21 DIAGNOSIS — L509 Urticaria, unspecified: Secondary | ICD-10-CM

## 2023-03-22 DIAGNOSIS — L509 Urticaria, unspecified: Secondary | ICD-10-CM | POA: Insufficient documentation

## 2023-03-22 NOTE — Telephone Encounter (Signed)

## 2023-03-29 ENCOUNTER — Encounter: Payer: Self-pay | Admitting: Sports Medicine

## 2023-04-02 ENCOUNTER — Encounter: Payer: 59 | Admitting: Sports Medicine

## 2023-04-05 ENCOUNTER — Other Ambulatory Visit: Payer: Self-pay | Admitting: Sports Medicine

## 2023-04-05 DIAGNOSIS — E782 Mixed hyperlipidemia: Secondary | ICD-10-CM

## 2023-04-16 ENCOUNTER — Other Ambulatory Visit: Payer: Self-pay

## 2023-04-16 ENCOUNTER — Encounter: Payer: 59 | Admitting: Sports Medicine

## 2023-04-16 ENCOUNTER — Ambulatory Visit (INDEPENDENT_AMBULATORY_CARE_PROVIDER_SITE_OTHER): Payer: 59 | Admitting: Sports Medicine

## 2023-04-16 ENCOUNTER — Encounter (INDEPENDENT_AMBULATORY_CARE_PROVIDER_SITE_OTHER): Payer: 59 | Admitting: Sports Medicine

## 2023-04-16 VITALS — BP 145/95 | Ht 73.0 in | Wt 235.0 lb

## 2023-04-16 DIAGNOSIS — M75112 Incomplete rotator cuff tear or rupture of left shoulder, not specified as traumatic: Secondary | ICD-10-CM

## 2023-04-16 DIAGNOSIS — F411 Generalized anxiety disorder: Secondary | ICD-10-CM | POA: Diagnosis not present

## 2023-04-16 DIAGNOSIS — M7552 Bursitis of left shoulder: Secondary | ICD-10-CM

## 2023-04-16 DIAGNOSIS — R161 Splenomegaly, not elsewhere classified: Secondary | ICD-10-CM

## 2023-04-16 DIAGNOSIS — M7711 Lateral epicondylitis, right elbow: Secondary | ICD-10-CM

## 2023-04-16 DIAGNOSIS — M25512 Pain in left shoulder: Secondary | ICD-10-CM

## 2023-04-16 MED ORDER — METHYLPREDNISOLONE ACETATE 40 MG/ML IJ SUSP
40.0000 mg | Freq: Once | INTRAMUSCULAR | Status: AC
  Administered 2023-04-16: 40 mg via INTRA_ARTICULAR

## 2023-04-16 NOTE — Progress Notes (Unsigned)
PCP: Monica Becton, MD  SUBJECTIVE:   HPI:  Patient is a 43 y.o. RHD male here with chief complaint of left shoulder pain.   Ongoing for the past 3-4 days. No inciting injury, though did notice it after leaning with his arms abducted for ~1 hour. Pain in anterolateral shoulder, though starting to migrate "deep" to bone and up the trapezius. Doesn't radiate down the arm.  Hx of RTC repair in [place] 20 years ago and has always had some minor discomfort in the shoulder since then. Has been managing w/ ibuprofen 600-800mg  TID the past few days, though still significantly painful. + night pain, thought about going to the ED last night due to the pain.   Pertinent ROS were reviewed with the patient and found to be negative unless otherwise specified above in HPI.   PERTINENT  PMH / PSH / FH / SH:  Past Medical, Surgical, Social, and Family History Reviewed & Updated in the EMR.  Pertinent findings include:  Hx of left RTC tear/repair 20 years ago, otherwise non-contributory  No Known Allergies  OBJECTIVE:  BP (!) 145/95   Ht 6\' 1"  (1.854 m)   Wt 235 lb (106.6 kg)   BMI 31.00 kg/m   PHYSICAL EXAM:  GEN: Alert and Oriented, NAD, comfortable in exam room RESP: Unlabored respirations, symmetric chest rise PSY: normal mood, congruent affect   LEFT SHOULDER MSK EXAM: No swelling, ecchymoses.  No gross deformity. TTP over anterior proximal humerus. No AC joint TTP.  FROM. +++ Juanetta Gosling, [place]. Negative Speeds Yergasons. Strength 4/5 with empty can (+pain), 5/5 with full can and resisted internal/external rotation. NV intact distally.  Complete MSK [place] of Left Shoulder Patient was seated on exam table and shoulder US examination was performed using high frequency linear probe. Images saved to PACS. -Biceps tendon visualized within the bicipital groove in both SAX and LAX with small hypoechoic fluid collection and partial thickness tendinous disruption proximally and deep  best seen in LAX.  -Subscapularis tendon visualized in both SAX and LAX with intact tendon fibers without signs of irregularity. No significant hypoechoic changes. Subacromial bursal distention noted. -Supraspinatus tendon visualized in both SAX and LAX with small partial thickness articular surface tear. No notable atrophy or other abnormality. -Infraspinatus and teres minor tendons were visualized in both SAX and LAX with intact tendon fibers without signs of irregularity. No significant hypoechoic changes. -Posterior glenohumeral joint was visualized with proper alignment, small tear seen in posterior labrum. No significant joint capsule swelling. [facility] Joint was visualized without bursal distension or significant bony spurs/arthritic changes.    IMPRESSION: Subacromial bursitis with partial thickness tearing of supraspinatus and long head of the biceps tendon.   U/S performed and interpreted by Glean Salen, MD with supervision of Denny Levy, MD.  Assessment & Plan Subacromial bursitis of left shoulder joint Acute left shoulder pain with presentation most c/w impingement syndrome secondary to partial thickness supraspinatus tear. Noted partial thickness tearing in left biceps tendon as well on ultrasound today.  Plan: -Ultrasound performed today as above. -Discussed treatment options.  He is a candidate for subacromial cortisone injection today.  He elected to proceed with this.  Risks and benefits reviewed.  Please see procedure note below. -Recommended rest and avoiding overhead movement with the shoulder for the next 3-4 weeks. -HEP program and theraband provided today to start next week once pain is improved -Discussed icing and NSAIDs prn over the weekend and into the next 4-6 weeks. -F/u in 4-6  weeks to reassess, if persistent pain consider MRI of the shoulder especially with findings c/w labrum pathology noted on today's ultrasound. Nontraumatic incomplete tear of left rotator  cuff Hx of previous left RTC repair ~20 years ago. Impression and plan as noted in above problem.  Left Subacromial Injection Procedure: After informed written consent timeout was performed, patient was in seated position on exam table.  Left shoulder was prepped with alcohol swab x2. Ethyl chloride spray used for topical anesthetic. Utilizing the posterior approach and a 25g needle, the left subacromial bursa was injected with 3:1 lidocaine:depomedrol.  Following the injection a bandage was applied to the area. Patient tolerated procedure well without immediate complications. The patient was counseled as to the expected post-injection course, including the possibility of worsening of pain with steroid flare. Instructed as to concerning symptoms and advised to contact the office if these should arise.   Glean Salen, MD PGY-4, Sports Medicine Fellow [facility]

## 2023-04-19 MED ORDER — DIAZEPAM 5 MG PO TABS: ORAL_TABLET | ORAL | 0 refills | Status: AC

## 2023-04-19 NOTE — Telephone Encounter (Signed)

## 2023-04-22 ENCOUNTER — Ambulatory Visit: Payer: 59

## 2023-04-22 ENCOUNTER — Other Ambulatory Visit: Payer: Self-pay | Admitting: Sports Medicine

## 2023-04-22 ENCOUNTER — Ambulatory Visit (INDEPENDENT_AMBULATORY_CARE_PROVIDER_SITE_OTHER): Payer: 59 | Admitting: Sports Medicine

## 2023-04-22 ENCOUNTER — Encounter: Payer: Self-pay | Admitting: Sports Medicine

## 2023-04-22 VITALS — BP 158/93 | HR 80 | Ht 73.0 in | Wt 235.0 lb

## 2023-04-22 DIAGNOSIS — R1013 Epigastric pain: Secondary | ICD-10-CM

## 2023-04-22 DIAGNOSIS — R030 Elevated blood-pressure reading, without diagnosis of hypertension: Secondary | ICD-10-CM

## 2023-04-22 DIAGNOSIS — Z0000 Encounter for general adult medical examination without abnormal findings: Secondary | ICD-10-CM | POA: Diagnosis not present

## 2023-04-22 DIAGNOSIS — Z1589 Genetic susceptibility to other disease: Secondary | ICD-10-CM

## 2023-04-22 MED ORDER — PANTOPRAZOLE SODIUM 40 MG PO TBEC: 40.0000 mg | DELAYED_RELEASE_TABLET | Freq: Two times a day (BID) | ORAL | 3 refills | Status: DC

## 2023-04-22 MED ORDER — BLOOD PRESSURE CUFF MISC: 0 refills | Status: DC

## 2023-04-22 MED ORDER — BLOOD PRESSURE CUFF MISC: 0 refills | Status: AC

## 2023-04-22 NOTE — Progress Notes (Signed)
[facility] Attending Note: I have examined the patient, reviewed the chart, discussed the assessment and plan with the Sports Medicine Fellow. I agree with assessment and treatment plan as detailed in the Fellow's note. I was present for procedures.

## 2023-04-22 NOTE — Progress Notes (Signed)
Subjective:    CC: Annual Physical Exam  HPI:  This patient is here for their annual physical  I reviewed the past medical history, family history, social history, surgical history, and allergies today and no changes were needed.  Please see the problem list section below in epic for further details.  Past Medical History: No past medical history on file. Past Surgical History: No past surgical history on file. Social History: Social History   Socioeconomic History   Marital status: Married    Spouse name: Not on file   Number of children: Not on file   Years of education: Not on file   Highest education level: Not on file  Occupational History   Not on file  Tobacco Use   Smoking status: Never   Smokeless tobacco: Never  Substance and Sexual Activity   Alcohol use: Not on file   Drug use: Not on file   Sexual activity: Not on file  Other Topics Concern   Not on file  Social History Narrative   Not on file   Social Drivers of Health   Financial Resource Strain: Not on file  Food Insecurity: No Food Insecurity (09/30/2021)   [facility]   Hunger Vital Sign    Worried About Running Out of Food in the Last Year: Never true    Ran Out of Food in the Last Year: Never true  Transportation Needs: Not on file  Physical Activity: Not on file  Stress: Not on file  Social Connections: Unknown (03/10/2022)   [facility]   Social Network    Social Network: Not on file   Family History: No family history on file. Allergies: No Known Allergies Medications: See med rec.  Review of Systems: No headache, visual changes, nausea, vomiting, diarrhea, constipation, dizziness, abdominal pain, skin rash, fevers, chills, night sweats, swollen lymph nodes, weight loss, chest pain, body aches, joint swelling, muscle aches, shortness of breath, mood changes, visual or auditory hallucinations.  Objective:    General: Well  Developed, well nourished, and in no acute distress.  Neuro: Alert and oriented x3, extra-ocular muscles intact, sensation grossly intact. Cranial nerves II through XII are intact, motor, sensory, and coordinative functions are all intact. HEENT: Normocephalic, atraumatic, pupils equal round reactive to light, neck supple, no masses, no lymphadenopathy, thyroid nonpalpable. Oropharynx, nasopharynx, external ear canals are unremarkable. Skin: Warm and dry, no rashes noted.  Cardiac: Regular rate and rhythm, no murmurs rubs or gallops.  Respiratory: Clear to auscultation bilaterally. Not using accessory muscles, speaking in full sentences.  Abdominal: Soft, midepigastric discomfort, positive Murphy sign, nondistended, positive bowel sounds, no masses, no organomegaly.  Musculoskeletal: Shoulder, elbow, wrist, hip, knee, ankle stable, and with full range of motion.  Impression and Recommendations:    The patient was counselled, risk factors were discussed, anticipatory guidance given.  Annual physical exam Fasting annual physical, Brett Hoffman plans to get his flu shot at an outside facility, he will let us know when he gets it.  Dyspepsia Persistent midepigastric pain, Brett Hoffman is reporting elevated heart rates, occasional palpitations, dark and tarry stools. He was drinking approximately 3 drinks per night but this is cut back to none. He does have a positive Murphy sign on exam and some tenderness in the midepigastrium.   Elevated blood pressure reading Blood pressure has been elevated on 2 occasions, we will have him do ambulatory blood pressure monitoring. He recently cut back on his drinking which can  be related.   ____________________________________________ Ihor Austin. Benjamin Stain, M.D., ABFM., CAQSM., AME. Primary Care and Sports Medicine [place] [facility]  Adjunct Professor of Family Medicine  [place] [employer] [facility] [employer]  Stage manager

## 2023-04-22 NOTE — Assessment & Plan Note (Signed)
Persistent midepigastric pain, Brett Hoffman is reporting elevated heart rates, occasional palpitations, dark and tarry stools. He was drinking approximately 3 drinks per night but this is cut back to none. He does have a positive Murphy sign on exam and some tenderness in the midepigastrium.

## 2023-04-22 NOTE — Assessment & Plan Note (Signed)
Blood pressure has been elevated on 2 occasions, we will have him do ambulatory blood pressure monitoring. He recently cut back on his drinking which can be related.

## 2023-04-22 NOTE — Assessment & Plan Note (Signed)
Fasting annual physical, Brett Hoffman plans to get his flu shot at an outside facility, he will let us know when he gets it.

## 2023-04-23 ENCOUNTER — Encounter: Payer: Self-pay | Admitting: Sports Medicine

## 2023-04-23 DIAGNOSIS — R161 Splenomegaly, not elsewhere classified: Secondary | ICD-10-CM | POA: Insufficient documentation

## 2023-04-23 NOTE — Telephone Encounter (Signed)

## 2023-04-23 NOTE — Addendum Note (Signed)
Addended by: Monica Becton on: 04/23/2023 03:41 PM   Modules accepted: Orders

## 2023-04-28 LAB — COMPREHENSIVE METABOLIC PANEL
AST: 25 [IU]/L (ref 0–40)
Albumin: 5 g/dL (ref 4.1–5.1)
Alkaline Phosphatase: 90 [IU]/L (ref 44–121)
Bilirubin Total: 1.2 mg/dL (ref 0.0–1.2)
CO2: 23 mmol/L (ref 20–29)
Glucose: 87 mg/dL (ref 70–99)
Total Protein: 7.5 g/dL (ref 6.0–8.5)
eGFR: 111 mL/min/{1.73_m2} (ref >59)

## 2023-04-28 LAB — H. PYLORI BREATH TEST: H pylori Breath Test: NEGATIVE

## 2023-04-28 LAB — COMPREHENSIVE METABOLIC PANEL WITH GFR
ALT: 38 IU/L (ref 0–44)
BUN/Creatinine Ratio: 13 (ref 9–20)
BUN: 11 mg/dL (ref 6–24)
Calcium: 9.9 mg/dL (ref 8.7–10.2)
Chloride: 98 mmol/L (ref 96–106)
Creatinine, Ser: 0.88 mg/dL (ref 0.76–1.27)
Globulin, Total: 2.5 g/dL (ref 1.5–4.5)
Potassium: 4.2 mmol/L (ref 3.5–5.2)
Sodium: 137 mmol/L (ref 134–144)

## 2023-04-28 LAB — CBC
Hematocrit: 51.9 % — ABNORMAL HIGH (ref 37.5–51.0)
Hemoglobin: 18.2 g/dL — ABNORMAL HIGH (ref 13.0–17.7)
MCH: 33.5 pg — ABNORMAL HIGH (ref 26.6–33.0)
MCHC: 35.1 g/dL (ref 31.5–35.7)
MCV: 95 fL (ref 79–97)
Platelets: 218 x10E3/uL (ref 150–450)
RBC: 5.44 x10E6/uL (ref 4.14–5.80)
RDW: 12.5 % (ref 11.6–15.4)
WBC: 6.1 x10E3/uL (ref 3.4–10.8)

## 2023-04-28 LAB — LIPID PANEL
Chol/HDL Ratio: 3.7 {ratio} (ref 0.0–5.0)
Cholesterol, Total: 213 mg/dL — ABNORMAL HIGH (ref 100–199)
HDL: 57 mg/dL (ref >39)
LDL Chol Calc (NIH): 135 mg/dL — ABNORMAL HIGH (ref 0–99)
Triglycerides: 118 mg/dL (ref 0–149)
VLDL Cholesterol Cal: 21 mg/dL (ref 5–40)

## 2023-04-28 LAB — HEMOGLOBIN A1C
Est. average glucose Bld gHb Est-mCnc: 94 mg/dL
Hgb A1c MFr Bld: 4.9 % (ref 4.8–5.6)

## 2023-04-28 LAB — MTHFR DNA ANALYSIS

## 2023-04-28 LAB — LIPASE: Lipase: 25 U/L (ref 13–78)

## 2023-04-28 LAB — TSH: TSH: 1.9 u[IU]/mL (ref 0.450–4.500)

## 2023-05-21 ENCOUNTER — Other Ambulatory Visit: Payer: 59

## 2023-05-27 ENCOUNTER — Ambulatory Visit

## 2023-05-27 DIAGNOSIS — R161 Splenomegaly, not elsewhere classified: Secondary | ICD-10-CM | POA: Diagnosis not present

## 2023-06-07 ENCOUNTER — Telehealth: Payer: Self-pay

## 2023-06-07 NOTE — Telephone Encounter (Signed)
 Called and left a detailed voice mail message on patient's home # that radiology has not read these results yet but we would contact him once these images have been read.

## 2023-06-07 NOTE — Telephone Encounter (Signed)
 Patient called requesting results of abdominal US  Radiologist has not yet read these results.  Would you like me to change this to STAT read with radiology?

## 2023-06-07 NOTE — Telephone Encounter (Signed)
 Yes please

## 2023-06-07 NOTE — Telephone Encounter (Signed)
 Copied from CRM [id number]. Topic: Clinical - Lab/Test Results >> Jun 07, 2023  4:08 PM Shelah Lewandowsky wrote: Reason for CRM: Patient called for [place] results but hung up before he could be connected

## 2023-06-08 NOTE — Telephone Encounter (Signed)
 Called radilogy reading room - order changed to STAT

## 2023-06-09 NOTE — Telephone Encounter (Signed)
 Patient informed of Dr. Benjamin Stain results and he states he still has a few questions regarding the results that he will send a Mychart message to Dr. Benjamin Stain.

## 2023-06-14 ENCOUNTER — Encounter: Payer: Self-pay | Admitting: Sports Medicine

## 2023-06-14 DIAGNOSIS — E669 Obesity, unspecified: Secondary | ICD-10-CM

## 2023-06-15 MED ORDER — ZEPBOUND 2.5 MG/0.5ML ~~LOC~~ SOAJ: 2.5000 mg | SUBCUTANEOUS | 0 refills | Status: DC

## 2023-06-15 NOTE — Assessment & Plan Note (Signed)
 Brett Hoffman has struggled with weight, did not respond well to [facility], we will try Zepbound, if unable to get this approved we will try compounded tirzepatide.  For insurance purposes he will be enrolled in a multidisciplinary weight loss program with calorie counting and an exercise prescription, he has no contraindication to GLP-1's and he will be on only the 1 weight loss medication.

## 2023-06-15 NOTE — Telephone Encounter (Signed)
 Brett Hoffman has struggled with weight, did not respond well to [facility], we will try Zepbound, if unable to get this approved we will try compounded tirzepatide.  For insurance purposes he will be enrolled in a multidisciplinary weight loss program with calorie counting and an exercise prescription, he has no contraindication to GLP-1's and he will be on only the 1 weight loss medication.

## 2023-06-16 ENCOUNTER — Telehealth: Payer: Self-pay | Admitting: Pharmacy Technician

## 2023-06-16 ENCOUNTER — Other Ambulatory Visit (HOSPITAL_COMMUNITY): Payer: Self-pay

## 2023-06-16 NOTE — Telephone Encounter (Signed)
 Pharmacy Patient Advocate Encounter   Received notification from CoverMyMeds that prior authorization for Zepbound 2.5MG /0.5ML pen-injectors is required/requested.   Insurance verification completed.   The patient is insured through [employer] .   Patient will need to schedule an office visit for a weight and BMI check before a prior authorization can be submitted.

## 2023-06-16 NOTE — Telephone Encounter (Signed)
 I have updated his most recent physical exam from January with his BMI.  This can be used to resubmit.  Thanks [employer].

## 2023-06-17 ENCOUNTER — Other Ambulatory Visit (HOSPITAL_COMMUNITY): Payer: Self-pay

## 2023-06-17 NOTE — Telephone Encounter (Signed)
 Pharmacy Patient Advocate Encounter  Received notification from [employer] that Prior Authorization for Zepbound 2.5MG /0.5ML pen-injectors has been APPROVED from 06/17/2023 to 02/12/2024. Ran test claim, Copay is $150.00. This test claim was processed through [facility]- copay amounts may vary at other pharmacies due to pharmacy/plan contracts, or as the patient moves through the different stages of their insurance plan.   PA #/Case ID/Reference #: [id number]

## 2023-06-17 NOTE — Telephone Encounter (Signed)
 Pharmacy Patient Advocate Encounter   Received notification from CoverMyMeds that prior authorization for Zepbound 2.5MG /0.5ML pen-injectors is required/requested.   Insurance verification completed.   The patient is insured through [employer] .   Per test claim: PA required; PA submitted to above mentioned insurance via CoverMyMeds Key/confirmation #/EOC [id number] Status is pending

## 2023-06-18 NOTE — Telephone Encounter (Signed)
 Zepbound has been approved in separate encounter

## 2023-07-15 ENCOUNTER — Encounter (INDEPENDENT_AMBULATORY_CARE_PROVIDER_SITE_OTHER): Payer: Self-pay | Admitting: Sports Medicine

## 2023-07-15 DIAGNOSIS — E669 Obesity, unspecified: Secondary | ICD-10-CM

## 2023-07-15 MED ORDER — ONDANSETRON 8 MG PO TBDP: 8.0000 mg | ORAL_TABLET | Freq: Three times a day (TID) | ORAL | 3 refills | Status: DC | PRN

## 2023-07-15 NOTE — Addendum Note (Signed)
 Addended by: Gean Keels on: 07/15/2023 02:33 PM   Modules accepted: Orders

## 2023-07-16 IMAGING — US US SCROTUM
1 series · 14 of 25 positions shown · non-contrast
Comparison: None.

CLINICAL DATA: Bilateral testicular plane, possible varicocele.

EXAM:
ULTRASOUND OF SCROTUM
TECHNIQUE: Complete ultrasound examination of the testicles, epididymis, and
other scrotal structures was performed.

[Series 1: us scrotum · 14 of 44 slices shown]
[im 1/44]
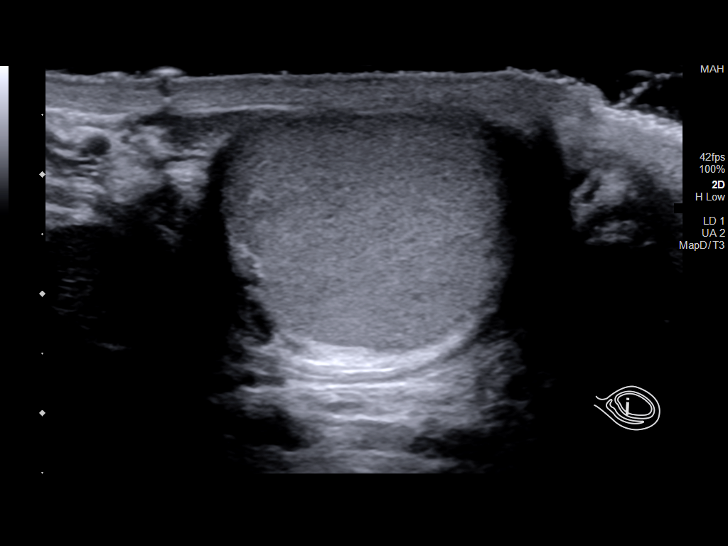
[im 4/44]
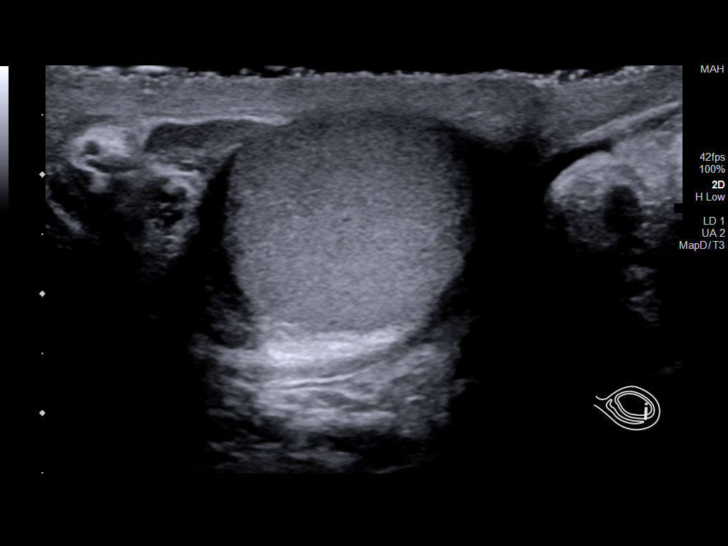
[im 8/44]
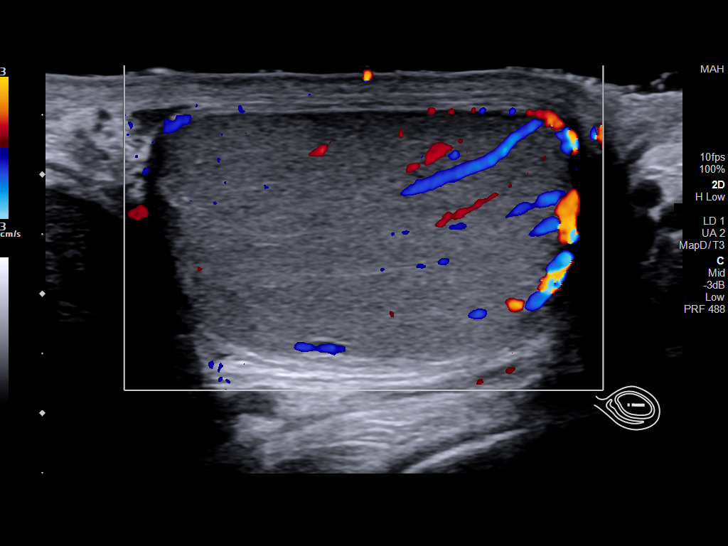
[im 11/44]
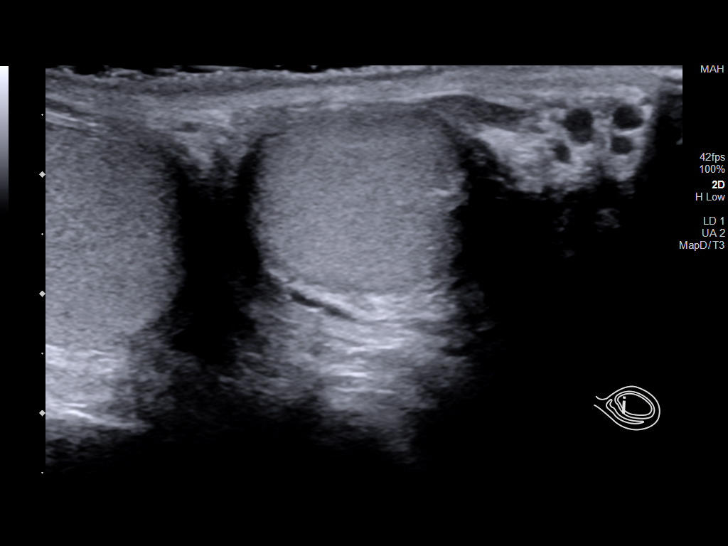
[im 15/44]
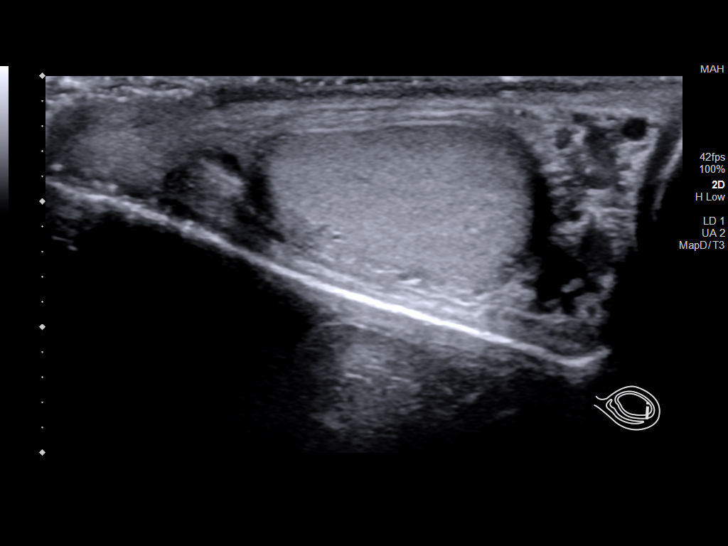
[im 17/44]
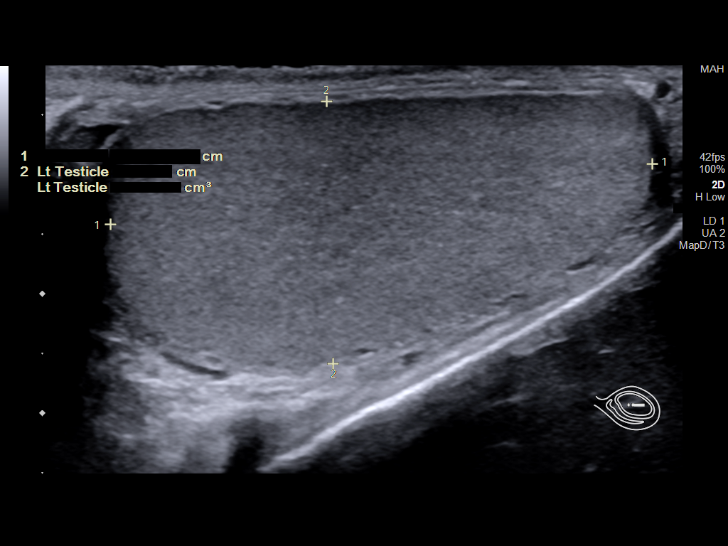
[im 20/44]
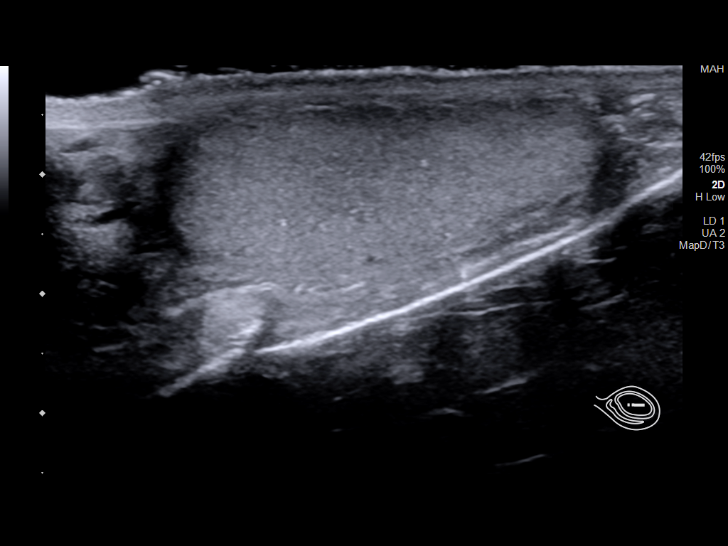
[im 24/44]
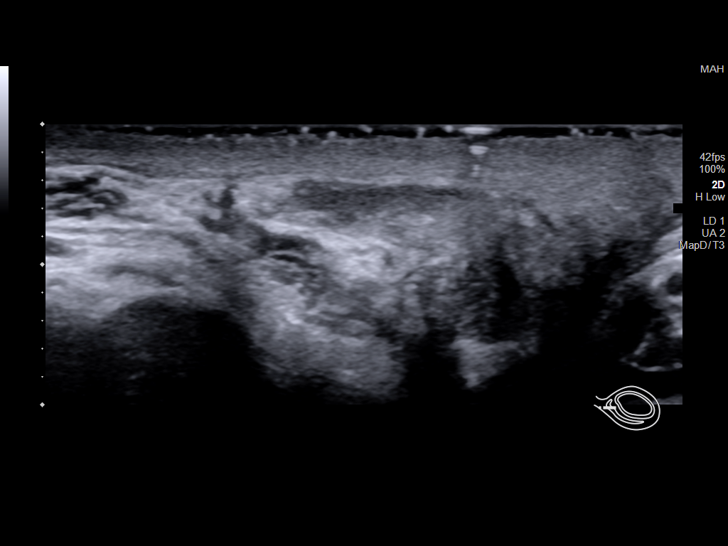
[im 27/44]
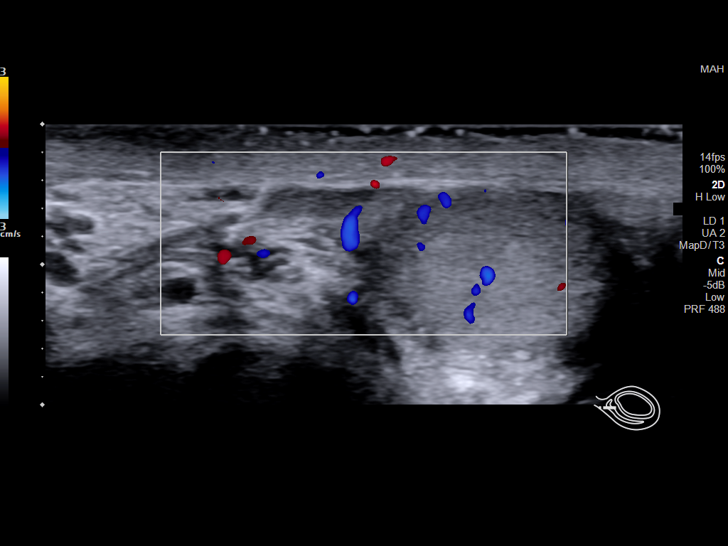
[im 29/44]
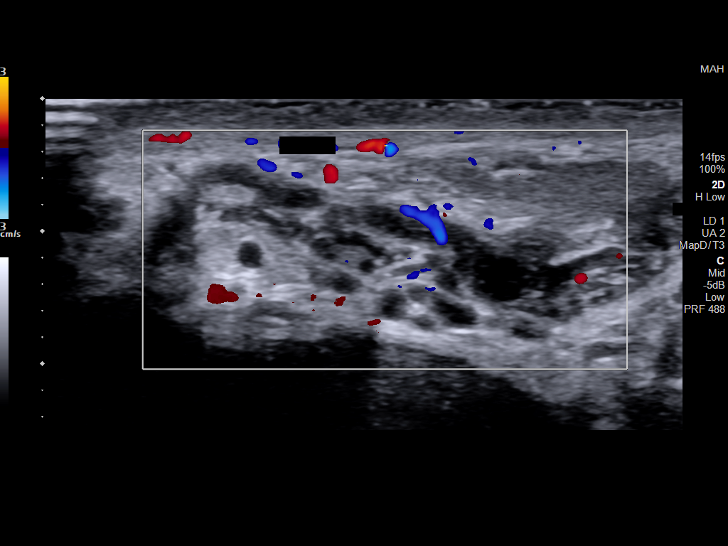
[im 33/44]
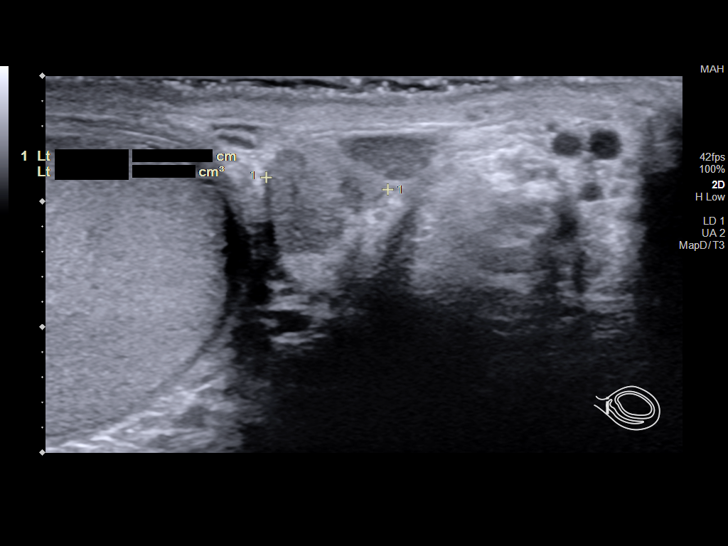
[im 36/44]
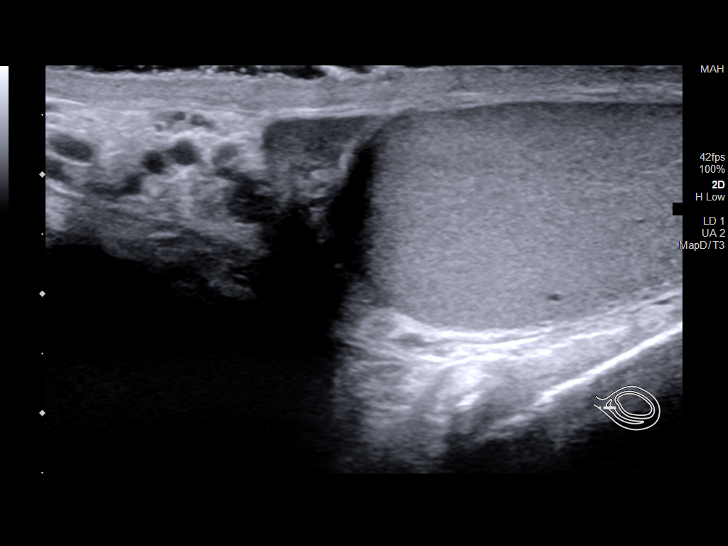
[im 40/44]
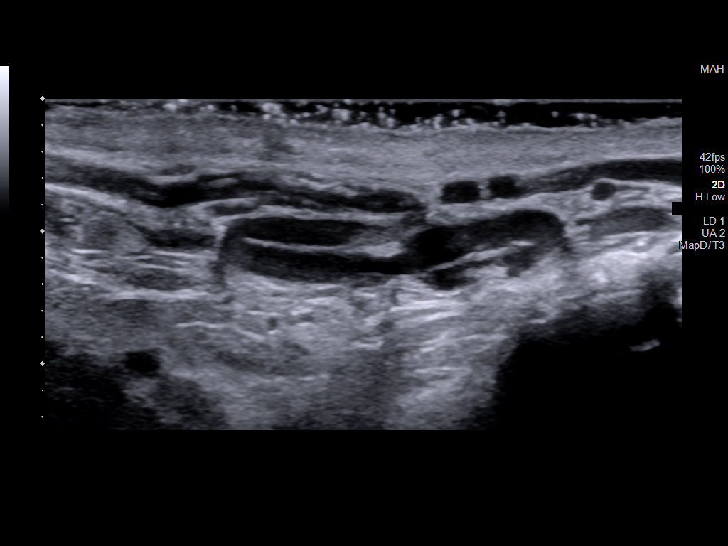
[im 44/44]
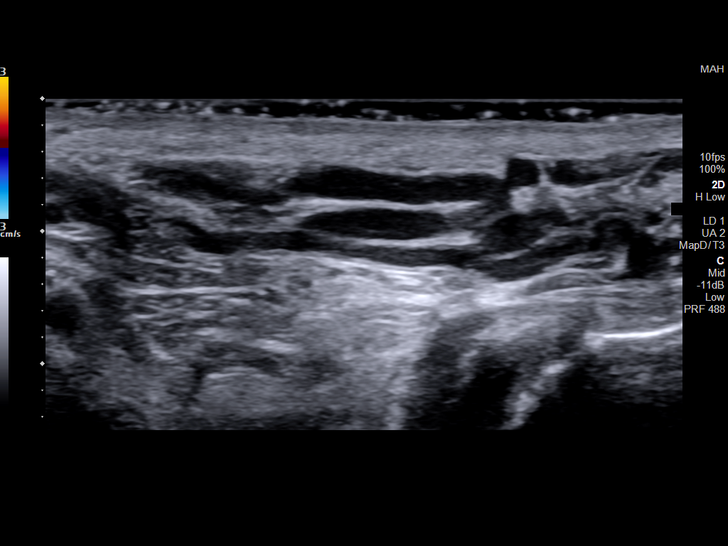

[14 of 25 positions shown; findings below may reference images not displayed]

FINDINGS: Right testicle

Measurements: 4.2 x 3.3 x 2.5 cm. No mass or microlithiasis
visualized.

Left testicle

Measurements: 4.6 x 3.1 x 2.2 cm. No mass or microlithiasis
visualized.

Right epididymis:  Normal in size and appearance.

Left epididymis:  Normal in size and appearance.

Hydrocele:  None visualized.

Varicocele:  Mild left varicocele.
IMPRESSION: 1. Mild left sided varicocele.
2. Otherwise unremarkable scrotal ultrasound.

## 2023-07-22 MED ORDER — MOUNJARO 5 MG/0.5ML ~~LOC~~ SOAJ: 5.0000 mg | SUBCUTANEOUS | 0 refills | Status: DC

## 2023-07-22 MED ORDER — MOUNJARO 7.5 MG/0.5ML ~~LOC~~ SOAJ: 7.5000 mg | SUBCUTANEOUS | 0 refills | Status: DC

## 2023-07-22 MED ORDER — MOUNJARO 10 MG/0.5ML ~~LOC~~ SOAJ: 10.0000 mg | SUBCUTANEOUS | 0 refills | Status: DC

## 2023-07-22 MED ORDER — MOUNJARO 12.5 MG/0.5ML ~~LOC~~ SOAJ: 12.5000 mg | SUBCUTANEOUS | 0 refills | Status: DC

## 2023-07-22 NOTE — Telephone Encounter (Signed)

## 2023-07-22 NOTE — Addendum Note (Signed)
 Addended by: Gean Keels on: 07/22/2023 12:24 PM   Modules accepted: Orders

## 2023-07-22 NOTE — Telephone Encounter (Signed)
 Requesting rx rf of zepbound   Last written as 2.5mg  06/15/2023 Last OV 04/22/2023 Upcoming appt = none

## 2023-07-26 MED ORDER — ZEPBOUND 5 MG/0.5ML ~~LOC~~ SOAJ: 5.0000 mg | SUBCUTANEOUS | 0 refills | Status: DC

## 2023-07-26 NOTE — Addendum Note (Signed)
 Addended by: Gean Keels on: 07/26/2023 05:17 PM   Modules accepted: Orders

## 2023-07-26 NOTE — Assessment & Plan Note (Signed)
 Brett Hoffman has struggled with his weight, he did not respond to Wegovy , he will be doing Zepbound , he will be enrolled in a multidisciplinary weight loss program with calorie counting and an exercise prescription. He has no contraindications to GLP-1 treatment and he will not be on any other weight loss medication. I like to see him back after about 3 months on the dose titration.

## 2023-07-26 NOTE — Telephone Encounter (Signed)
 Obesity (BMI 30-39.9) Brett Hoffman has struggled with his weight, he did not respond to Wegovy , he will be doing Zepbound , he will be enrolled in a multidisciplinary weight loss program with calorie counting and an exercise prescription. He has no contraindications to GLP-1 treatment and he will not be on any other weight loss medication. I like to see him back after about 3 months on the dose titration.

## 2023-07-27 ENCOUNTER — Other Ambulatory Visit: Payer: Self-pay

## 2023-07-27 ENCOUNTER — Other Ambulatory Visit (HOSPITAL_COMMUNITY): Payer: Self-pay

## 2023-08-17 MED ORDER — ZEPBOUND 10 MG/0.5ML ~~LOC~~ SOAJ: 10.0000 mg | SUBCUTANEOUS | 0 refills | Status: DC

## 2023-08-17 MED ORDER — ZEPBOUND 15 MG/0.5ML ~~LOC~~ SOAJ: 15.0000 mg | SUBCUTANEOUS | 11 refills | Status: DC

## 2023-08-17 MED ORDER — ZEPBOUND 12.5 MG/0.5ML ~~LOC~~ SOAJ: 12.5000 mg | SUBCUTANEOUS | 0 refills | Status: DC

## 2023-08-17 MED ORDER — ZEPBOUND 7.5 MG/0.5ML ~~LOC~~ SOAJ: 7.5000 mg | SUBCUTANEOUS | 0 refills | Status: DC

## 2023-08-17 NOTE — Addendum Note (Signed)
 Addended by: Gean Keels on: 08/17/2023 10:04 AM   Modules accepted: Orders

## 2023-08-17 NOTE — Telephone Encounter (Signed)
 Requesting zepbound  strength increase and be sent in for 2- 3 monthsrefills  Last written as zepbound  5mg   07/26/2023 Last OV 04/22/2023 Upcoming appt = none

## 2023-09-03 ENCOUNTER — Other Ambulatory Visit: Payer: Self-pay | Admitting: Sports Medicine

## 2023-09-03 DIAGNOSIS — E782 Mixed hyperlipidemia: Secondary | ICD-10-CM

## 2023-09-03 DIAGNOSIS — F411 Generalized anxiety disorder: Secondary | ICD-10-CM

## 2023-11-02 ENCOUNTER — Other Ambulatory Visit: Payer: Self-pay

## 2023-11-02 MED ORDER — SCOPOLAMINE 1 MG/3DAYS TD PT72: 1.0000 | MEDICATED_PATCH | TRANSDERMAL | 11 refills | Status: AC

## 2023-11-14 ENCOUNTER — Encounter: Payer: Self-pay | Admitting: Sports Medicine

## 2023-11-15 NOTE — Telephone Encounter (Signed)
 Left message for a return call

## 2023-11-23 ENCOUNTER — Encounter: Payer: Self-pay | Admitting: Sports Medicine

## 2023-12-21 ENCOUNTER — Ambulatory Visit (INDEPENDENT_AMBULATORY_CARE_PROVIDER_SITE_OTHER): Payer: Self-pay | Admitting: Urgent Care

## 2023-12-21 ENCOUNTER — Encounter: Payer: Self-pay | Admitting: Urgent Care

## 2023-12-21 VITALS — BP 123/87 | HR 96 | Ht 73.0 in | Wt 222.0 lb

## 2023-12-21 DIAGNOSIS — R197 Diarrhea, unspecified: Secondary | ICD-10-CM

## 2023-12-21 DIAGNOSIS — R932 Abnormal findings on diagnostic imaging of liver and biliary tract: Secondary | ICD-10-CM

## 2023-12-21 DIAGNOSIS — E782 Mixed hyperlipidemia: Secondary | ICD-10-CM | POA: Diagnosis not present

## 2023-12-21 DIAGNOSIS — F411 Generalized anxiety disorder: Secondary | ICD-10-CM | POA: Diagnosis not present

## 2023-12-21 DIAGNOSIS — D582 Other hemoglobinopathies: Secondary | ICD-10-CM | POA: Diagnosis not present

## 2023-12-21 DIAGNOSIS — R161 Splenomegaly, not elsewhere classified: Secondary | ICD-10-CM

## 2023-12-21 DIAGNOSIS — R1084 Generalized abdominal pain: Secondary | ICD-10-CM

## 2023-12-21 NOTE — Patient Instructions (Addendum)
 We drew labs today to further assess the cause of your abdominal symptoms. Please also complete the MRCP as ordered.  Follow up in office in three weeks.

## 2023-12-21 NOTE — Progress Notes (Unsigned)
 Established Patient Office Visit  Subjective:  Patient ID: Brett Hoffman, male    DOB: Jan 10, 1981  Age: 43 y.o. MRN: [id number]  Chief Complaint  Patient presents with  . transfer of care    Abdominal pain x 6 weeks    HPI  Patient Active Problem List   Diagnosis Date Noted  . Splenomegaly associated with hepatic steatosis 04/23/2023  . Elevated blood pressure reading 04/22/2023  . Lateral epicondylitis, left elbow 07/20/2022  . Mass of right inguinal region 01/28/2022  . Lesion of nose 01/28/2022  . Retractile testis 12/18/2021  . Obesity (BMI 30-39.9) 10/01/2021  . Lesion of penis 04/11/2021  . Testicular pain 01/07/2021  . Excessive daytime sleepiness 03/01/2020  . Dyspepsia 10/04/2019  . Mixed hyperlipidemia 12/29/2018  . 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 Irritable bowel syndrome with diarrhea 12/14/2018  . Asthma, moderate persistent 08/25/2018  . Thyroid  nodule 05/10/2017  . Generalized anxiety disorder 02/23/2017  . Annual physical exam 02/23/2017  . Carpal tunnel syndrome on both sides 02/23/2017  . Right tennis elbow 02/26/2015  . Chronic meniscal tear of knee 12/18/2013  . Bilateral shoulder pain 12/18/2013   No past medical history on file. No past surgical history on file. Social History   Tobacco Use  . Smoking status: Never  . Smokeless tobacco: Never      ROS: as noted in HPI  Objective:     BP 123/87   Pulse 96   Ht 6' 1 (1.854 m)   Wt 222 lb (100.7 kg)   SpO2 98%   BMI 29.29 kg/m  BP Readings from Last 3 Encounters:  12/21/23 123/87  04/22/23 (!) 158/93  04/16/23 (!) 145/95   Wt Readings from Last 3 Encounters:  12/21/23 222 lb (100.7 kg)  04/22/23 235 lb (106.6 kg)  04/16/23 235 lb (106.6 kg)      Physical Exam   No results found for any visits on 12/21/23.  Last CBC Lab Results  Component Value Date   WBC 6.1 04/22/2023   HGB 18.2 (H) 04/22/2023   HCT 51.9 (H) 04/22/2023   MCV 95 04/22/2023    MCH 33.5 (H) 04/22/2023   RDW 12.5 04/22/2023   PLT 218 04/22/2023   Last metabolic panel Lab Results  Component Value Date   GLUCOSE 87 04/22/2023   NA 137 04/22/2023   K 4.2 04/22/2023   CL 98 04/22/2023   CO2 23 04/22/2023   BUN 11 04/22/2023   CREATININE 0.88 04/22/2023   EGFR 111 04/22/2023   CALCIUM  9.9 04/22/2023   PROT 7.5 04/22/2023   ALBUMIN 5.0 04/22/2023   LABGLOB 2.5 04/22/2023   BILITOT 1.2 04/22/2023   ALKPHOS 90 04/22/2023   AST 25 04/22/2023   ALT 38 04/22/2023   Last lipids Lab Results  Component Value Date   CHOL 213 (H) 04/22/2023   HDL 57 04/22/2023   LDLCALC 135 (H) 04/22/2023   TRIG 118 04/22/2023   CHOLHDL 3.7 04/22/2023   Last hemoglobin A1c Lab Results  Component Value Date   HGBA1C 4.9 04/22/2023   Last thyroid  functions Lab Results  Component Value Date   TSH 1.900 04/22/2023   Last vitamin D  Lab Results  Component Value Date   VD25OH 51.1 11/26/2022   Last vitamin B12 and Folate Lab Results  Component Value Date   VITAMINB12 355 11/26/2022      The 10-year ASCVD risk score (Arnett DK, et al., 2019) is: 1.1%  Assessment & Plan:  Splenomegaly associated with hepatic steatosis -     CBC with Differential/Platelet -     Sedimentation rate -     Calprotectin, Fecal -     ANCA Titers ( [facility]) -     CMP14+EGFR -     Hepatitis C antibody -     Lupus Diagnostic Profile -     EPSTEIN-BARR VIRUS (EBV) Antibody Profile -     Ferritin -     Erythropoietin -     Reticulocytes -     MR ABDOMEN MRCP W WO CONTAST; Future  Mixed hyperlipidemia  Generalized anxiety disorder  Elevated hemoglobin -     CBC with Differential/Platelet -     Ferritin -     Erythropoietin -     Reticulocytes -     MR ABDOMEN MRCP W WO CONTAST; Future  Generalized abdominal pain -     ANCA Titers ( [facility] [facility]) -     CMP14+EGFR -     Hepatitis C antibody -     Lupus Diagnostic Profile -      EPSTEIN-BARR VIRUS (EBV) Antibody Profile -     Ferritin -     Erythropoietin -     Reticulocytes -     MR ABDOMEN MRCP W WO CONTAST; Future  Diarrhea, unspecified type -     Calprotectin, Fecal  Abnormal liver ultrasound -     CBC with Differential/Platelet -     ANCA Titers ( [facility]) -     Hepatitis C antibody -     Lupus Diagnostic Profile -     EPSTEIN-BARR VIRUS (EBV) Antibody Profile -     Ferritin -     Erythropoietin -     MR ABDOMEN MRCP W WO CONTAST; Future     Return in about 3 weeks (around 01/11/2024).   Benton LITTIE Gave, PA

## 2023-12-22 ENCOUNTER — Ambulatory Visit: Payer: Self-pay | Admitting: Urgent Care

## 2023-12-22 ENCOUNTER — Encounter: Payer: Self-pay | Admitting: Urgent Care

## 2023-12-22 DIAGNOSIS — R718 Other abnormality of red blood cells: Secondary | ICD-10-CM

## 2023-12-22 DIAGNOSIS — R1084 Generalized abdominal pain: Secondary | ICD-10-CM

## 2023-12-22 DIAGNOSIS — R197 Diarrhea, unspecified: Secondary | ICD-10-CM

## 2023-12-22 DIAGNOSIS — D582 Other hemoglobinopathies: Secondary | ICD-10-CM

## 2023-12-22 DIAGNOSIS — R161 Splenomegaly, not elsewhere classified: Secondary | ICD-10-CM

## 2023-12-27 ENCOUNTER — Ambulatory Visit

## 2023-12-27 DIAGNOSIS — K7581 Nonalcoholic steatohepatitis (NASH): Secondary | ICD-10-CM

## 2023-12-27 DIAGNOSIS — R1084 Generalized abdominal pain: Secondary | ICD-10-CM

## 2023-12-27 DIAGNOSIS — R161 Splenomegaly, not elsewhere classified: Secondary | ICD-10-CM

## 2023-12-27 DIAGNOSIS — K760 Fatty (change of) liver, not elsewhere classified: Secondary | ICD-10-CM

## 2023-12-27 DIAGNOSIS — R932 Abnormal findings on diagnostic imaging of liver and biliary tract: Secondary | ICD-10-CM

## 2023-12-27 DIAGNOSIS — D582 Other hemoglobinopathies: Secondary | ICD-10-CM

## 2023-12-27 MED ORDER — GADOBUTROL 1 MMOL/ML IV SOLN
10.0000 mL | Freq: Once | INTRAVENOUS | Status: AC | PRN
  Administered 2023-12-27: 10 mL via INTRAVENOUS

## 2023-12-28 LAB — SPECIMEN STATUS REPORT

## 2023-12-28 LAB — CALPROTECTIN, FECAL: Calprotectin, Fecal: 7 ug/g (ref 0–120)

## 2023-12-31 LAB — JAK2 V617F RFX CALR/MPL/E12-15

## 2024-01-07 LAB — JAK2 V617F RFX CALR/MPL/E12-15

## 2024-01-07 LAB — CALR +MPL + E12-E15  (REFLEX)

## 2024-01-10 LAB — CBC WITH DIFFERENTIAL/PLATELET
Basophils Absolute: 0 x10E3/uL (ref 0.0–0.2)
Basos: 1 %
EOS (ABSOLUTE): 0.1 x10E3/uL (ref 0.0–0.4)
Eos: 1 %
Hematocrit: 50.2 % (ref 37.5–51.0)
Hemoglobin: 17.1 g/dL (ref 13.0–17.7)
Immature Grans (Abs): 0 x10E3/uL (ref 0.0–0.1)
Immature Granulocytes: 0 %
Lymphocytes Absolute: 1.5 x10E3/uL (ref 0.7–3.1)
Lymphs: 24 %
MCH: 33.7 pg — ABNORMAL HIGH (ref 26.6–33.0)
MCHC: 34.1 g/dL (ref 31.5–35.7)
MCV: 99 fL — ABNORMAL HIGH (ref 79–97)
Monocytes Absolute: 0.5 x10E3/uL (ref 0.1–0.9)
Monocytes: 8 %
Neutrophils Absolute: 4 x10E3/uL (ref 1.4–7.0)
Neutrophils: 65 %
Platelets: 231 x10E3/uL (ref 150–450)
RBC: 5.07 x10E6/uL (ref 4.14–5.80)
RDW: 12.8 % (ref 11.6–15.4)
WBC: 6 x10E3/uL (ref 3.4–10.8)

## 2024-01-10 LAB — CMP14+EGFR
ALT: 28 IU/L (ref 0–44)
AST: 21 IU/L (ref 0–40)
Albumin: 4.8 g/dL (ref 4.1–5.1)
Alkaline Phosphatase: 70 IU/L (ref 47–123)
BUN/Creatinine Ratio: 11 (ref 9–20)
BUN: 10 mg/dL (ref 6–24)
Bilirubin Total: 1 mg/dL (ref 0.0–1.2)
CO2: 25 mmol/L (ref 20–29)
Calcium: 9.7 mg/dL (ref 8.7–10.2)
Chloride: 98 mmol/L (ref 96–106)
Creatinine, Ser: 0.9 mg/dL (ref 0.76–1.27)
Globulin, Total: 2.2 g/dL (ref 1.5–4.5)
Glucose: 86 mg/dL (ref 70–99)
Potassium: 4.2 mmol/L (ref 3.5–5.2)
Sodium: 138 mmol/L (ref 134–144)
Total Protein: 7 g/dL (ref 6.0–8.5)
eGFR: 110 mL/min/1.73 (ref >59)

## 2024-01-10 LAB — SEDIMENTATION RATE: Sed Rate: 2 mm/h (ref 0–15)

## 2024-01-10 LAB — LUPUS DIAGNOSTIC PROFILE
Anti-Chromatin Ab, IgG (RDL): <20 U (ref <20)
Anti-La (SS-B) Ab (RDL): <20 U (ref <20)
Anti-Nuclear Ab by IFA (RDL): POSITIVE — AB
Anti-Ro (SS-A) Ab (RDL): <20 U (ref <20)
Anti-Sm Ab (RDL): <20 U (ref <20)
Anti-U1 RNP Ab (RDL): <20 U (ref <20)
Anti-dsDNA Ab by Farr(RDL): <8 [IU]/mL (ref <8.0)
C3 Complement (RDL): 150 mg/dL (ref 90–180)
C4 Complement (RDL): 22 mg/dL (ref 10–40)

## 2024-01-10 LAB — FERRITIN: Ferritin: 386 ng/mL (ref 30–400)

## 2024-01-10 LAB — ANA TITER AND PATTERN: Speckled Pattern: 1:160 {titer} — ABNORMAL HIGH

## 2024-01-10 LAB — EPSTEIN-BARR VIRUS (EBV) ANTIBODY PROFILE
EBV NA IgG: 263 U/mL — ABNORMAL HIGH (ref 0.0–17.9)
EBV VCA IgG: 595 U/mL — ABNORMAL HIGH (ref 0.0–17.9)
EBV VCA IgM: <36 U/mL (ref 0.0–35.9)

## 2024-01-10 LAB — RETICULOCYTES: Retic Ct Pct: 2.8 % — ABNORMAL HIGH (ref 0.6–2.6)

## 2024-01-10 LAB — HEPATITIS C ANTIBODY: Hep C Virus Ab: NONREACTIVE

## 2024-01-10 LAB — ANCA TITERS
Atypical pANCA: <1:20 {titer}
C-ANCA: <1:20 {titer}
P-ANCA: <1:20 {titer}

## 2024-01-10 LAB — ERYTHROPOIETIN: Erythropoietin: 5 m[IU]/mL (ref 2.6–18.5)

## 2024-01-12 ENCOUNTER — Ambulatory Visit: Admitting: Urgent Care

## 2024-01-18 ENCOUNTER — Ambulatory Visit (INDEPENDENT_AMBULATORY_CARE_PROVIDER_SITE_OTHER)

## 2024-01-18 VITALS — Temp 98.7°F

## 2024-01-18 DIAGNOSIS — Z23 Encounter for immunization: Secondary | ICD-10-CM | POA: Diagnosis not present

## 2024-01-26 ENCOUNTER — Telehealth: Payer: Self-pay | Admitting: Pharmacy Technician

## 2024-01-26 NOTE — Telephone Encounter (Signed)
 Pharmacy Patient Advocate Encounter   Received notification from [employer] that prior authorization for Zepbound  2.5MG /0.5ML pen-injectors is due for renewal.   Insurance verification completed.   The patient is insured through [employer].  Action: Medication has been discontinued. Archived Key: [id number]

## 2024-01-27 ENCOUNTER — Encounter: Admitting: Urgent Care
